# Patient Record
Sex: Female | Born: 1960 | Race: White | Hispanic: No | Marital: Married | Smoking: Never smoker
Health system: Southern US, Community
[De-identification: ages and names within clinical notes are randomized; demographics above are authoritative.]

## PROBLEM LIST (undated history)

## (undated) DIAGNOSIS — E039 Hypothyroidism, unspecified: Secondary | ICD-10-CM

## (undated) DIAGNOSIS — E538 Deficiency of other specified B group vitamins: Secondary | ICD-10-CM

## (undated) DIAGNOSIS — E0500 Thyrotoxicosis with diffuse goiter without thyrotoxic crisis or storm: Secondary | ICD-10-CM

## (undated) DIAGNOSIS — R200 Anesthesia of skin: Secondary | ICD-10-CM

## (undated) DIAGNOSIS — D649 Anemia, unspecified: Secondary | ICD-10-CM

## (undated) DIAGNOSIS — Z85820 Personal history of malignant melanoma of skin: Secondary | ICD-10-CM

## (undated) DIAGNOSIS — R202 Paresthesia of skin: Secondary | ICD-10-CM

## (undated) DIAGNOSIS — R531 Weakness: Secondary | ICD-10-CM

## (undated) DIAGNOSIS — D509 Iron deficiency anemia, unspecified: Secondary | ICD-10-CM

## (undated) HISTORY — DX: Deficiency of other specified B group vitamins: E53.8

## (undated) HISTORY — DX: Anesthesia of skin: R20.0

## (undated) HISTORY — DX: Personal history of malignant melanoma of skin: Z85.820

## (undated) HISTORY — DX: Thyrotoxicosis with diffuse goiter without thyrotoxic crisis or storm: E05.00

## (undated) HISTORY — DX: Iron deficiency anemia, unspecified: D50.9

## (undated) HISTORY — DX: Hypothyroidism, unspecified: E03.9

## (undated) HISTORY — DX: Paresthesia of skin: R20.2

## (undated) HISTORY — DX: Weakness: R53.1

## (undated) HISTORY — DX: Anemia, unspecified: D64.9

---

## 1998-11-22 ENCOUNTER — Ambulatory Visit (HOSPITAL_COMMUNITY): Admission: RE | Admit: 1998-11-22 | Discharge: 1998-11-22 | Payer: Self-pay | Admitting: *Deleted

## 1998-11-22 ENCOUNTER — Encounter: Payer: Self-pay | Admitting: *Deleted

## 2000-12-31 ENCOUNTER — Other Ambulatory Visit: Admission: RE | Admit: 2000-12-31 | Discharge: 2000-12-31 | Payer: Self-pay | Admitting: *Deleted

## 2002-12-21 ENCOUNTER — Other Ambulatory Visit: Admission: RE | Admit: 2002-12-21 | Discharge: 2002-12-21 | Payer: Self-pay | Admitting: Obstetrics and Gynecology

## 2002-12-22 ENCOUNTER — Ambulatory Visit (HOSPITAL_COMMUNITY): Admission: RE | Admit: 2002-12-22 | Discharge: 2002-12-22 | Payer: Self-pay | Admitting: Obstetrics and Gynecology

## 2002-12-22 ENCOUNTER — Encounter: Payer: Self-pay | Admitting: Obstetrics and Gynecology

## 2002-12-28 ENCOUNTER — Encounter: Admission: RE | Admit: 2002-12-28 | Discharge: 2002-12-28 | Payer: Self-pay | Admitting: Obstetrics and Gynecology

## 2002-12-28 ENCOUNTER — Encounter: Payer: Self-pay | Admitting: Obstetrics and Gynecology

## 2004-01-03 ENCOUNTER — Other Ambulatory Visit: Admission: RE | Admit: 2004-01-03 | Discharge: 2004-01-03 | Payer: Self-pay | Admitting: Obstetrics and Gynecology

## 2004-01-18 ENCOUNTER — Encounter: Admission: RE | Admit: 2004-01-18 | Discharge: 2004-01-18 | Payer: Self-pay | Admitting: Obstetrics and Gynecology

## 2004-01-29 ENCOUNTER — Encounter: Admission: RE | Admit: 2004-01-29 | Discharge: 2004-01-29 | Payer: Self-pay | Admitting: Obstetrics and Gynecology

## 2004-07-16 ENCOUNTER — Encounter: Admission: RE | Admit: 2004-07-16 | Discharge: 2004-07-16 | Payer: Self-pay | Admitting: Obstetrics and Gynecology

## 2004-12-25 ENCOUNTER — Other Ambulatory Visit: Admission: RE | Admit: 2004-12-25 | Discharge: 2004-12-25 | Payer: Self-pay | Admitting: Obstetrics and Gynecology

## 2005-01-23 ENCOUNTER — Encounter: Admission: RE | Admit: 2005-01-23 | Discharge: 2005-01-23 | Payer: Self-pay | Admitting: Obstetrics and Gynecology

## 2005-12-10 ENCOUNTER — Ambulatory Visit: Payer: Self-pay | Admitting: Internal Medicine

## 2006-01-04 ENCOUNTER — Ambulatory Visit: Payer: Self-pay | Admitting: Internal Medicine

## 2006-01-21 ENCOUNTER — Ambulatory Visit: Payer: Self-pay | Admitting: Internal Medicine

## 2006-04-01 ENCOUNTER — Encounter: Admission: RE | Admit: 2006-04-01 | Discharge: 2006-04-01 | Payer: Self-pay | Admitting: Obstetrics and Gynecology

## 2007-04-04 ENCOUNTER — Encounter: Admission: RE | Admit: 2007-04-04 | Discharge: 2007-04-04 | Payer: Self-pay | Admitting: Obstetrics and Gynecology

## 2008-09-27 ENCOUNTER — Encounter: Admission: RE | Admit: 2008-09-27 | Discharge: 2008-09-27 | Payer: Self-pay | Admitting: Obstetrics and Gynecology

## 2010-08-29 NOTE — Assessment & Plan Note (Signed)
[facility]                           GASTROENTEROLOGY OFFICE NOTE   NAME:Katherine Bradford, Katherine Bradford                      MRN:          [id number]  DATE:12/10/2005                            DOB:          25-Dec-1960    REFERRING PHYSICIAN:  Deatra James, MD   HISTORY OF PRESENT ILLNESS:  Katherine Bradford is a 50 year old white female who is  here self-referred for problems with swallowing.  For the past four months,  she had fullness when she swallowed solid food.  This is intermittent and  not every day.  She denies any problem with swallowing saliva or liquids.  The first episode occurred in February of this year and lasted about four to  six weeks and then subsided, but three months ago, she had another episode.  Since then, she has had some trouble with swallowing solids.  She has had no  regurgitation of the food, no heartburn.  Several years ago, she was  diagnosed with gastroesophageal reflux, but never had any x-rays or  endoscopic studies done.  There is a history of Graves disease diagnosed  many years ago and followed by Dr. Dagoberto Ligas.  She has not taken any PPIs or  suppressing agent, but does take calcium in Tums.   MEDICATIONS:  None.   PAST MEDICAL HISTORY:  Thyroid problems, Graves disease 22 years ago.   FAMILY HISTORY:  Positive for heart disease in her father.   SOCIAL HISTORY:  Married with two children.  She is a Sales executive.  She  does not smoke and does not drink alcohol.   REVIEW OF SYSTEMS:  Positive for fever, skin rash, night sweats, sore  throat.   PHYSICAL EXAMINATION:  VITAL SIGNS:  Blood pressure 108/78, pulse 68, weight  151 pounds.  GENERAL:  She was alert and oriented, in no distress.  Her voice was normal.  NECK:  Supple.  There was no goiter.  LUNGS:  Clear to auscultation.  No wheezes or rales.  COR:  Normal S1, normal S2.  ABDOMEN:  Soft and nontender with normoactive bowel sounds.  EXTREMITIES:  No edema.   IMPRESSION:  A 50 year old white female with intermittent globus sensation  in her throat, not necessarily dysphagia.  This could be from esophagitis  due to gastroesophageal reflux, rule out esophageal spasm, esophageal web,  or esophageal stricture.  She has no symptoms of gastroesophageal reflux.   PLAN:  I have discussed upper endoscopy and passive esophageal dilation  versus barium swallow as well as trial of a PPI.  She would like to try the  PPI first using Protonix 40 mg daily.  I gave her book from [employer] on  gastroesophageal reflux as well as prescription for Protonix that  she will take for six weeks.  After six weeks, we will decide whether she  needs endoscopy or barium swallow and also if she needs to stay on Protonix  or discontinue it.  Katherine Bradford. [name], MD   DMB/MedQ  DD:  12/10/2005  DT:  12/11/2005  Job #:  [id number]   cc:   Deatra James, MD

## 2011-03-03 ENCOUNTER — Other Ambulatory Visit: Payer: Self-pay | Admitting: Obstetrics and Gynecology

## 2011-03-03 DIAGNOSIS — Z1231 Encounter for screening mammogram for malignant neoplasm of breast: Secondary | ICD-10-CM

## 2011-03-27 ENCOUNTER — Ambulatory Visit
Admission: RE | Admit: 2011-03-27 | Discharge: 2011-03-27 | Disposition: A | Discharge status: Home / Self Care | Payer: BC Managed Care – PPO | Source: Ambulatory Visit | Attending: Obstetrics and Gynecology | Admitting: Obstetrics and Gynecology

## 2011-03-27 DIAGNOSIS — Z1231 Encounter for screening mammogram for malignant neoplasm of breast: Secondary | ICD-10-CM

## 2011-04-08 ENCOUNTER — Encounter: Payer: Self-pay | Admitting: Internal Medicine

## 2011-05-15 ENCOUNTER — Other Ambulatory Visit: Payer: BC Managed Care – PPO | Admitting: Internal Medicine

## 2012-03-28 ENCOUNTER — Other Ambulatory Visit: Payer: Self-pay | Admitting: Obstetrics and Gynecology

## 2012-03-28 DIAGNOSIS — Z1231 Encounter for screening mammogram for malignant neoplasm of breast: Secondary | ICD-10-CM

## 2012-04-05 ENCOUNTER — Ambulatory Visit
Admission: RE | Admit: 2012-04-05 | Discharge: 2012-04-05 | Disposition: A | Discharge status: Home / Self Care | Payer: BC Managed Care – PPO | Source: Ambulatory Visit | Attending: Obstetrics and Gynecology | Admitting: Obstetrics and Gynecology

## 2012-04-05 DIAGNOSIS — Z1231 Encounter for screening mammogram for malignant neoplasm of breast: Secondary | ICD-10-CM

## 2012-08-30 ENCOUNTER — Emergency Department (HOSPITAL_COMMUNITY): Payer: BC Managed Care – PPO

## 2012-08-30 ENCOUNTER — Emergency Department (HOSPITAL_COMMUNITY)
Admission: EM | Admit: 2012-08-30 | Discharge: 2012-08-30 | Disposition: A | Discharge status: Home / Self Care | Payer: BC Managed Care – PPO | Attending: Emergency Medicine | Admitting: Emergency Medicine

## 2012-08-30 ENCOUNTER — Encounter (HOSPITAL_COMMUNITY): Payer: Self-pay | Admitting: Emergency Medicine

## 2012-08-30 DIAGNOSIS — S0990XA Unspecified injury of head, initial encounter: Secondary | ICD-10-CM | POA: Insufficient documentation

## 2012-08-30 DIAGNOSIS — Y92009 Unspecified place in unspecified non-institutional (private) residence as the place of occurrence of the external cause: Secondary | ICD-10-CM | POA: Insufficient documentation

## 2012-08-30 DIAGNOSIS — W19XXXA Unspecified fall, initial encounter: Secondary | ICD-10-CM

## 2012-08-30 DIAGNOSIS — S0230XA Fracture of orbital floor, unspecified side, initial encounter for closed fracture: Secondary | ICD-10-CM | POA: Insufficient documentation

## 2012-08-30 DIAGNOSIS — S0232XA Fracture of orbital floor, left side, initial encounter for closed fracture: Secondary | ICD-10-CM

## 2012-08-30 DIAGNOSIS — Y9301 Activity, walking, marching and hiking: Secondary | ICD-10-CM | POA: Insufficient documentation

## 2012-08-30 DIAGNOSIS — W108XXA Fall (on) (from) other stairs and steps, initial encounter: Secondary | ICD-10-CM | POA: Insufficient documentation

## 2012-08-30 MED ORDER — OXYCODONE-ACETAMINOPHEN 5-325 MG PO TABS
2.0000 | ORAL_TABLET | Freq: Once | ORAL | Status: AC
  Administered 2012-08-30: 2 via ORAL
  Filled 2012-08-30: qty 2

## 2012-08-30 MED ORDER — ONDANSETRON 4 MG PO TBDP
ORAL_TABLET | ORAL | Status: AC
  Administered 2012-08-30: 4 mg via ORAL
  Filled 2012-08-30: qty 1

## 2012-08-30 MED ORDER — OXYCODONE-ACETAMINOPHEN 5-325 MG PO TABS: 2.0000 | ORAL_TABLET | ORAL | Status: DC | PRN

## 2012-08-30 MED ORDER — ONDANSETRON 4 MG PO TBDP
4.0000 mg | ORAL_TABLET | Freq: Once | ORAL | Status: AC
  Administered 2012-08-30: 4 mg via ORAL

## 2012-08-30 MED ORDER — ONDANSETRON 4 MG PO TBDP: 4.0000 mg | ORAL_TABLET | Freq: Three times a day (TID) | ORAL | Status: DC | PRN

## 2012-08-30 NOTE — ED Notes (Signed)
Patient comes in stating she fell at home. Patient states she lost her footing. Landed on left side of head and hit left cheek. Patient denies any LOC. Patient is awake and oriented at this time. Patient only complaining of head pain.

## 2012-08-30 NOTE — ED Provider Notes (Signed)
History     CSN: [id number]  Arrival date & time 08/30/12  9562   First MD Initiated Contact with Patient 08/30/12 0630      Chief Complaint  Patient presents with  . Fall    (Consider location/radiation/quality/duration/timing/severity/associated sxs/prior treatment) HPI Comments: Patient is a 52 year old female who presents to the ED after a fall this morning. Patient reports walking down the stairs when she slipped and landed on the hardwood floor on her left face. Patient currently complains of left sided facial pain. She reports trying to break her fall with her right arm but denies right arm pain at this time. The left facial pain started suddenly after the fall and is throbbing and severe. Palpation makes the pain worse. Nothing makes the pain better. Patient has not tried anything for pain relief. No other associated symptoms. Patient denies LOC or any other injury.   Patient is a 52 y.o. female presenting with fall.  Fall Associated symptoms include headaches.    History reviewed. No pertinent past medical history.  History reviewed. No pertinent past surgical history.  History reviewed. No pertinent family history.  History  Substance Use Topics  . Smoking status: Never Smoker   . Smokeless tobacco: Not on file  . Alcohol Use: No    OB History   Grav Para Term Preterm Abortions TAB SAB Ect Mult Living                  Review of Systems  HENT:       Facial pain  Neurological: Positive for headaches.  All other systems reviewed and are negative.    Allergies  Prednisone; Epinephrine; and Keflex  Home Medications   Current Outpatient Rx  Name  Route  Sig  Dispense  Refill  . meloxicam (MOBIC) 15 MG tablet   Oral   Take 15 mg by mouth daily.           BP 101/57  Pulse 58  Temp(Src) 98.1 F (36.7 C) (Oral)  Resp 16  SpO2 99%  Physical Exam  Nursing note and vitals reviewed. Constitutional: She is oriented to person, place, and time. She  appears well-developed and well-nourished. No distress.  HENT:  Head: Normocephalic and atraumatic.  Mouth/Throat: Oropharynx is clear and moist. No oropharyngeal exudate.  Left zygomatic tenderness to palpation. No obvious deformity. No bruising noted.   Eyes: Conjunctivae and EOM are normal. Pupils are equal, round, and reactive to light. No scleral icterus.  EOM intact of left eye. No hyphema noted.   Neck: Normal range of motion.  Cardiovascular: Normal rate and regular rhythm.  Exam reveals no gallop and no friction rub.   No murmur heard. Pulmonary/Chest: Effort normal and breath sounds normal. She has no wheezes. She has no rales. She exhibits no tenderness.  Abdominal: Soft. She exhibits no distension. There is no tenderness. There is no rebound and no guarding.  Musculoskeletal: Normal range of motion.  Neurological: She is alert and oriented to person, place, and time. No cranial nerve deficit. Coordination normal.  Speech is goal-oriented. Moves limbs without ataxia.   Skin: Skin is warm and dry.  Psychiatric: She has a normal mood and affect. Her behavior is normal.    ED Course  Procedures (including critical care time)  Labs Reviewed - No data to display Ct Head Wo Contrast  08/30/2012   *RADIOLOGY REPORT*  Clinical Data:  History of trauma from a fall with injury to the head and  neck.  CT HEAD WITHOUT CONTRAST CT MAXILLOFACIAL WITHOUT CONTRAST CT CERVICAL SPINE WITHOUT CONTRAST  Technique:  Multidetector CT imaging of the head, cervical spine, and maxillofacial structures were performed using the standard protocol without intravenous contrast. Multiplanar CT image reconstructions of the cervical spine and maxillofacial structures were also generated.  Comparison:  No priors.  CT HEAD  Findings: No acute displaced skull fractures are identified.  No acute intracranial abnormality.  Specifically, no evidence of acute post-traumatic intracranial hemorrhage, no definite regions of  acute/subacute cerebral ischemia, no focal mass, mass effect, hydrocephalus or abnormal intra or extra-axial fluid collections. The visualized paranasal sinuses and mastoids are well pneumatized with the exception of a small amount of opacification in the left ethmoid sinuses.  IMPRESSION: 1.  No acute displaced skull fractures or acute intracranial abnormalities. 2.  The appearance of the brain is normal.  CT MAXILLOFACIAL  Findings:  There is a defect in the lamina papyracea on the left with a small amount of extraconal orbital fat extending into the defect in the left ethmoid sinuses. Along the inferior margin of this there is a tiny collection of high attenuation material (image 52 of series 6 and image 24 of series 602) which may represent some chronic inspissated secretions, or may represent a small amount of hemorrhage.  Other than this finding, there are no new other overt signs of acute trauma.  It is uncertain whether not this represents a chronic injury or an acute medial orbital blowout fracture. No entrapment of extraocular muscles.  No other acute displaced facial bone fractures are identified.  Mandibular condyles are properly located.  Pterygoid plates are intact.  Polypoid soft tissue attenuation lesions are present within the maxillary sinuses bilaterally and may represent mucosal retention cysts and/or polyps.  Bilateral globes are intact.  There is a small amount of soft tissue swelling in the subcutaneous fat anterior to the left maxilla, presumably a soft tissue contusion.  IMPRESSION: 1.  Soft tissue contusion in the subcutaneous fat into the left maxilla.   Small defect in the left lamina papyracea and herniation of small amount of extraconal orbital fat into the defect in the left ethmoid sinuses. There is one small focus of high attenuation material adjacent to this which could represent a small amount of blood, or may simply represent chronic inspissated secretions. Clinical correlation  for signs and symptoms of acute left-sided orbital blowout fracture is recommended.  If there is no prior history of trauma, this injury is likely acute. 2.  No other evidence of acute displaced facial bone fractures are noted. 3.  Mucosal retention cyst versus polyps in the maxillary sinuses bilaterally.  CT CERVICAL SPINE  Findings:   No acute displaced fractures of the cervical spine are noted.  Alignment is anatomic.  Prevertebral soft tissues are normal.  There is mild multilevel degenerative disc disease, most severe at C5-C6.  IMPRESSION: 1.  No evidence of significant acute traumatic injury to the cervical spine.  These results were called by telephone on 08/30/2012 at 08:35 a.m. to PA [facility], who verbally acknowledged these results.   Original Report Authenticated By: Trudie Reed, M.D.   Ct Cervical Spine Wo Contrast  08/30/2012   *RADIOLOGY REPORT*  Clinical Data:  History of trauma from a fall with injury to the head and neck.  CT HEAD WITHOUT CONTRAST CT MAXILLOFACIAL WITHOUT CONTRAST CT CERVICAL SPINE WITHOUT CONTRAST  Technique:  Multidetector CT imaging of the head, cervical spine, and maxillofacial structures were performed using  the standard protocol without intravenous contrast. Multiplanar CT image reconstructions of the cervical spine and maxillofacial structures were also generated.  Comparison:  No priors.  CT HEAD  Findings: No acute displaced skull fractures are identified.  No acute intracranial abnormality.  Specifically, no evidence of acute post-traumatic intracranial hemorrhage, no definite regions of acute/subacute cerebral ischemia, no focal mass, mass effect, hydrocephalus or abnormal intra or extra-axial fluid collections. The visualized paranasal sinuses and mastoids are well pneumatized with the exception of a small amount of opacification in the left ethmoid sinuses.  IMPRESSION: 1.  No acute displaced skull fractures or acute intracranial abnormalities. 2.  The  appearance of the brain is normal.  CT MAXILLOFACIAL  Findings:  There is a defect in the lamina papyracea on the left with a small amount of extraconal orbital fat extending into the defect in the left ethmoid sinuses. Along the inferior margin of this there is a tiny collection of high attenuation material (image 52 of series 6 and image 24 of series 602) which may represent some chronic inspissated secretions, or may represent a small amount of hemorrhage.  Other than this finding, there are no new other overt signs of acute trauma.  It is uncertain whether not this represents a chronic injury or an acute medial orbital blowout fracture. No entrapment of extraocular muscles.  No other acute displaced facial bone fractures are identified.  Mandibular condyles are properly located.  Pterygoid plates are intact.  Polypoid soft tissue attenuation lesions are present within the maxillary sinuses bilaterally and may represent mucosal retention cysts and/or polyps.  Bilateral globes are intact.  There is a small amount of soft tissue swelling in the subcutaneous fat anterior to the left maxilla, presumably a soft tissue contusion.  IMPRESSION: 1.  Soft tissue contusion in the subcutaneous fat into the left maxilla.   Small defect in the left lamina papyracea and herniation of small amount of extraconal orbital fat into the defect in the left ethmoid sinuses. There is one small focus of high attenuation material adjacent to this which could represent a small amount of blood, or may simply represent chronic inspissated secretions. Clinical correlation for signs and symptoms of acute left-sided orbital blowout fracture is recommended.  If there is no prior history of trauma, this injury is likely acute. 2.  No other evidence of acute displaced facial bone fractures are noted. 3.  Mucosal retention cyst versus polyps in the maxillary sinuses bilaterally.  CT CERVICAL SPINE  Findings:   No acute displaced fractures of the  cervical spine are noted.  Alignment is anatomic.  Prevertebral soft tissues are normal.  There is mild multilevel degenerative disc disease, most severe at C5-C6.  IMPRESSION: 1.  No evidence of significant acute traumatic injury to the cervical spine.  These results were called by telephone on 08/30/2012 at 08:35 a.m. to PA [facility], who verbally acknowledged these results.   Original Report Authenticated By: Trudie Reed, M.D.   Ct Maxillofacial Wo Cm  08/30/2012   *RADIOLOGY REPORT*  Clinical Data:  History of trauma from a fall with injury to the head and neck.  CT HEAD WITHOUT CONTRAST CT MAXILLOFACIAL WITHOUT CONTRAST CT CERVICAL SPINE WITHOUT CONTRAST  Technique:  Multidetector CT imaging of the head, cervical spine, and maxillofacial structures were performed using the standard protocol without intravenous contrast. Multiplanar CT image reconstructions of the cervical spine and maxillofacial structures were also generated.  Comparison:  No priors.  CT HEAD  Findings: No acute displaced  skull fractures are identified.  No acute intracranial abnormality.  Specifically, no evidence of acute post-traumatic intracranial hemorrhage, no definite regions of acute/subacute cerebral ischemia, no focal mass, mass effect, hydrocephalus or abnormal intra or extra-axial fluid collections. The visualized paranasal sinuses and mastoids are well pneumatized with the exception of a small amount of opacification in the left ethmoid sinuses.  IMPRESSION: 1.  No acute displaced skull fractures or acute intracranial abnormalities. 2.  The appearance of the brain is normal.  CT MAXILLOFACIAL  Findings:  There is a defect in the lamina papyracea on the left with a small amount of extraconal orbital fat extending into the defect in the left ethmoid sinuses. Along the inferior margin of this there is a tiny collection of high attenuation material (image 52 of series 6 and image 24 of series 602) which may represent some  chronic inspissated secretions, or may represent a small amount of hemorrhage.  Other than this finding, there are no new other overt signs of acute trauma.  It is uncertain whether not this represents a chronic injury or an acute medial orbital blowout fracture. No entrapment of extraocular muscles.  No other acute displaced facial bone fractures are identified.  Mandibular condyles are properly located.  Pterygoid plates are intact.  Polypoid soft tissue attenuation lesions are present within the maxillary sinuses bilaterally and may represent mucosal retention cysts and/or polyps.  Bilateral globes are intact.  There is a small amount of soft tissue swelling in the subcutaneous fat anterior to the left maxilla, presumably a soft tissue contusion.  IMPRESSION: 1.  Soft tissue contusion in the subcutaneous fat into the left maxilla.   Small defect in the left lamina papyracea and herniation of small amount of extraconal orbital fat into the defect in the left ethmoid sinuses. There is one small focus of high attenuation material adjacent to this which could represent a small amount of blood, or may simply represent chronic inspissated secretions. Clinical correlation for signs and symptoms of acute left-sided orbital blowout fracture is recommended.  If there is no prior history of trauma, this injury is likely acute. 2.  No other evidence of acute displaced facial bone fractures are noted. 3.  Mucosal retention cyst versus polyps in the maxillary sinuses bilaterally.  CT CERVICAL SPINE  Findings:   No acute displaced fractures of the cervical spine are noted.  Alignment is anatomic.  Prevertebral soft tissues are normal.  There is mild multilevel degenerative disc disease, most severe at C5-C6.  IMPRESSION: 1.  No evidence of significant acute traumatic injury to the cervical spine.  These results were called by telephone on 08/30/2012 at 08:35 a.m. to PA [facility], who verbally acknowledged these results.    Original Report Authenticated By: Trudie Reed, M.D.     1. Fracture of orbital floor, blow-out, left, closed, initial encounter   2. Fall, initial encounter       MDM  7:02 AM CT head, cervical spine and face pending. Patient will have percocet for pain. No neuro deficits. Vitals stable and patient afebrile.   9:00 AM Maxillofacial CT shows an acute blowout fracture of the left orbital floor. EOM intact of affected eye. Patient will be discharged with instructions to apply ice to the injury and follow up with Dr. Jeanice Lim who is Maxillofacial on call doc. Patient will have a prescription for pain medication. Patient understands and is agreeable to the plan.       Emilia Beck, [place] 08/30/12 [phone]

## 2012-08-30 NOTE — ED Notes (Signed)
Patient transported to CT 

## 2012-08-30 NOTE — ED Notes (Addendum)
C/o nausea & vomiting. ED PA informed & aware.

## 2012-08-31 NOTE — ED Provider Notes (Signed)
Medical screening examination/treatment/procedure(s) were conducted as a shared visit with non-physician practitioner(s) and myself.  I personally evaluated the patient during the encounter S/p trip and fall today. Contusion to face. Ct. No hyphema. Normal eom. No visual c/o.   Suzi Roots, MD 08/31/12 [phone]

## 2012-09-20 ENCOUNTER — Encounter: Payer: Self-pay | Admitting: Neurology

## 2012-09-20 DIAGNOSIS — R202 Paresthesia of skin: Secondary | ICD-10-CM | POA: Insufficient documentation

## 2012-09-20 DIAGNOSIS — R200 Anesthesia of skin: Secondary | ICD-10-CM | POA: Insufficient documentation

## 2012-09-20 DIAGNOSIS — E0500 Thyrotoxicosis with diffuse goiter without thyrotoxic crisis or storm: Secondary | ICD-10-CM | POA: Insufficient documentation

## 2012-09-20 DIAGNOSIS — R531 Weakness: Secondary | ICD-10-CM | POA: Insufficient documentation

## 2012-09-21 ENCOUNTER — Ambulatory Visit (INDEPENDENT_AMBULATORY_CARE_PROVIDER_SITE_OTHER): Payer: BC Managed Care – PPO | Admitting: Neurology

## 2012-09-21 ENCOUNTER — Telehealth: Payer: Self-pay | Admitting: Neurology

## 2012-09-21 ENCOUNTER — Encounter: Payer: Self-pay | Admitting: Neurology

## 2012-09-21 VITALS — BP 116/78 | HR 68 | Ht 64.25 in | Wt 159.0 lb

## 2012-09-21 DIAGNOSIS — R200 Anesthesia of skin: Secondary | ICD-10-CM

## 2012-09-21 DIAGNOSIS — E538 Deficiency of other specified B group vitamins: Secondary | ICD-10-CM

## 2012-09-21 DIAGNOSIS — R209 Unspecified disturbances of skin sensation: Secondary | ICD-10-CM

## 2012-09-21 DIAGNOSIS — E0500 Thyrotoxicosis with diffuse goiter without thyrotoxic crisis or storm: Secondary | ICD-10-CM

## 2012-09-21 MED ORDER — GABAPENTIN (ONCE-DAILY) 300 MG PO TABS: 300.0000 mg | ORAL_TABLET | Freq: Three times a day (TID) | ORAL | Status: DC

## 2012-09-21 NOTE — Telephone Encounter (Signed)
I spoke to pt and she is asking questions about MRI.  I told her Luster Landsberg, is her contact in our office.   She is asking when this might be done.   I told her authorization by insurance is needed.  Could be up to one week for this.  May have at [facility], since [place].

## 2012-09-21 NOTE — Progress Notes (Signed)
History of present illness:  Katherine Bradford is a 52 years old right-handed Caucasian female, referred to hand surgeon Dr. Teressa Senter, and her primary care physician Dr. Wynelle Link for evaluation of bilateral fingertips paresthesia  Over the past one year, without any triggering event, she noticed bilateral fingertips numbness tingling hypersensitivity, initially only involving left thumb, and index fingers, her symptoms gradually worse over the past one year,  It is involving all 5 fingers on the left and right hand, left worse than right, She described hypersensitivity over fingertips, difficulty to make a tight grip,   paresthesia also extending to her elbow level, she burned her finger while getting hot pizza out of the oven, she works as a Sales executive, 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  She denies neck pain, no shooting pain from her neck to her arm, and hands,   She denies bilateral lower extremity paresthesia, no leg weakness, no incontinence,   Review of Systems  Out of a complete 14 system review, the patient complains of only the following symptoms, and all other reviewed systems are negative.   Constitutional:   fatigue Cardiovascular:  murmur Ear/Nose/Throat:  N/A Skin: N/A Eyes: blurred vision Respiratory: N/A Gastroitestinal: N/A    Hematology/Lymphatic:  Anemia Endocrine:  N/A Musculoskeletal:N/A Allergy/Immunology: N/A Neurological:  Numbness, weakness. Psychiatric:    N/A  PHYSICAL EXAMINATOINS:  Generalized: In no acute distress  Neck: Supple, no carotid bruits   Cardiac: Regular rate rhythm  Pulmonary: Clear to auscultation bilaterally  Musculoskeletal: No deformity  Neurological examination  Mentation: Alert oriented to time, place, history taking, and causual conversation  Cranial nerve II-XII: Pupils were equal round reactive to light extraocular movements were full, visual field were full on confrontational test. facial sensation and strength were normal.  hearing was intact to finger rubbing bilaterally. Uvula tongue midline.  head turning and shoulder shrug and were normal and symmetric.Tongue protrusion into cheek strength was normal.  Motor: normal tone, bulk and strength.  Sensory: hypersensitivity of bilateral finger tips,  preserved vibratory sensation, and proprioception at toes.  Coordination: Normal finger to nose, heel-to-shin bilaterally there was no truncal ataxia  Gait: Rising up from seated position without assistance, normal stance, without trunk ataxia, moderate stride, good arm swing, smooth turning, able to perform tiptoe, and heel walking without difficulty.   Romberg signs: Negative  Deep tendon reflexes: Brachioradialis 2/2, biceps 2/2, triceps 2/2, patellar 2/2, Achilles 2/2, plantar responses were flexor bilaterally.  Assessment and plan: 52 years old right-handed Caucasian female, with past medical history of Graves' disease, anemia, now presenting with bilateral fingertips hypersensitivity, reported normal examination,  1.Differentiation diagnosis including vasculitis, cervical pathology. 2 MRI of cervical laboratory evaluation,.  3. Gabapentin 300 mg 3 times a day 4 return to clinic in 3-4 weeks.

## 2012-09-22 LAB — CBC
HCT: 39.6 % (ref 34.0–46.6)
MCV: 95 fL (ref 79–97)
Platelets: 292 10*3/uL (ref 155–379)
RBC: 4.17 x10E6/uL (ref 3.77–5.28)
RDW: 21.7 % — ABNORMAL HIGH (ref 12.3–15.4)
WBC: 6.1 10*3/uL (ref 3.4–10.8)

## 2012-09-22 LAB — COMPREHENSIVE METABOLIC PANEL
ALT: 15 IU/L (ref 0–32)
BUN/Creatinine Ratio: 15 (ref 9–23)
CO2: 25 mmol/L (ref 19–28)
Creatinine, Ser: 0.78 mg/dL (ref 0.57–1.00)
GFR calc non Af Amer: 88 mL/min/{1.73_m2} (ref >59)
Globulin, Total: 2.6 g/dL (ref 1.5–4.5)
Glucose: 95 mg/dL (ref 65–99)
Sodium: 141 mmol/L (ref 134–144)
Total Protein: 7.1 g/dL (ref 6.0–8.5)

## 2012-09-22 LAB — TSH: TSH: 6.77 u[IU]/mL — ABNORMAL HIGH (ref 0.450–4.500)

## 2012-09-22 LAB — HIV ANTIBODY (ROUTINE TESTING W REFLEX)
HIV 1/O/2 Abs-Index Value: <1 (ref <1.00)
HIV-1/HIV-2 Ab: NONREACTIVE

## 2012-09-22 LAB — FOLATE: Folate: >19.9 ng/mL (ref >3.0)

## 2012-09-22 LAB — ANA: Anti Nuclear Antibody(ANA): NEGATIVE

## 2012-09-22 LAB — VITAMIN B12: Vitamin B-12: <31 pg/mL — ABNORMAL LOW (ref 211–946)

## 2012-09-23 NOTE — Progress Notes (Signed)
Quick Note:  I left message for her to call back. Sandy, please call her again.  Low Vit B12 <31. She needs to come in for repeat lab test today, order placed,  1. also started vit b12 supplement today after lab draw, im once x 2 weeks, once once a week x4 months,   ______

## 2012-09-23 NOTE — Addendum Note (Signed)
Addended by: Levert Feinstein on: 09/23/2012 09:59 AM   Modules accepted: Orders

## 2012-09-23 NOTE — Addendum Note (Signed)
Addended by: Levert Feinstein on: 09/23/2012 10:03 AM   Modules accepted: Orders

## 2012-09-26 ENCOUNTER — Telehealth: Payer: Self-pay | Admitting: Neurology

## 2012-09-26 NOTE — Telephone Encounter (Signed)
I have left a message twice about low B12 and needing another blood draw and Vit B12 injections, no return call.

## 2012-09-27 ENCOUNTER — Encounter: Payer: Self-pay | Admitting: *Deleted

## 2012-09-27 NOTE — Progress Notes (Signed)
Mailed letter to pt , re: lab results and B12 supplementation.

## 2012-09-28 ENCOUNTER — Encounter: Payer: Self-pay | Admitting: Gastroenterology

## 2012-09-29 ENCOUNTER — Encounter: Payer: Self-pay | Admitting: Neurology

## 2012-09-30 NOTE — Telephone Encounter (Signed)
I have left her two messages for patient to return call and have not heard back. A letter was mailed to patient on 09-30-12, asking her to call the office about coming in for another blood draw, and explaining treatment plan.

## 2012-10-03 ENCOUNTER — Telehealth: Payer: Self-pay | Admitting: Neurology

## 2012-10-03 ENCOUNTER — Telehealth: Payer: Self-pay

## 2012-10-03 ENCOUNTER — Encounter: Payer: Self-pay | Admitting: *Deleted

## 2012-10-03 NOTE — Telephone Encounter (Signed)
Patient called in response to letter she received in the mail. She states that it is better to contact her at work. Patient schedule for B12 and bloodwork on Wednesday.

## 2012-10-03 NOTE — Telephone Encounter (Signed)
I have called her low Vit B12, <31, she will start supplement soon.  I will refer her to GI  Dr. Bonnye Fava for evaluation of low Vitamin B12.

## 2012-10-05 ENCOUNTER — Ambulatory Visit (INDEPENDENT_AMBULATORY_CARE_PROVIDER_SITE_OTHER): Payer: BC Managed Care – PPO

## 2012-10-05 ENCOUNTER — Other Ambulatory Visit: Payer: Self-pay | Admitting: Neurology

## 2012-10-05 DIAGNOSIS — E538 Deficiency of other specified B group vitamins: Secondary | ICD-10-CM

## 2012-10-05 MED ORDER — CYANOCOBALAMIN 1000 MCG/ML IJ SOLN
1000.0000 ug | Freq: Once | INTRAMUSCULAR | Status: AC
  Administered 2012-10-06: 1000 ug via INTRAMUSCULAR

## 2012-10-05 NOTE — Progress Notes (Signed)
Cyanocobalamin 1000 mcg/ml administered in L Deltoid under aseptic technique. Bandaid applied. Tolerated well.

## 2012-10-05 NOTE — Patient Instructions (Signed)
Counseled on rationale for use. Patient to schedule next dose for tomorrow.

## 2012-10-06 ENCOUNTER — Ambulatory Visit (INDEPENDENT_AMBULATORY_CARE_PROVIDER_SITE_OTHER): Payer: BC Managed Care – PPO | Admitting: Neurology

## 2012-10-06 DIAGNOSIS — E538 Deficiency of other specified B group vitamins: Secondary | ICD-10-CM

## 2012-10-06 NOTE — Progress Notes (Signed)
Patient here for B12 injection.  Under aseptic technique Cyanocobalamin 1000mcg/1ml given IM in right deltoid.  Tolerated well.  Band-Aid applied. 

## 2012-10-06 NOTE — Patient Instructions (Signed)
Patient will return tomorrow for second B12 injection.

## 2012-10-07 ENCOUNTER — Encounter: Payer: Self-pay | Admitting: *Deleted

## 2012-10-07 ENCOUNTER — Ambulatory Visit: Payer: BC Managed Care – PPO | Admitting: Gastroenterology

## 2012-10-07 ENCOUNTER — Ambulatory Visit (INDEPENDENT_AMBULATORY_CARE_PROVIDER_SITE_OTHER): Payer: BC Managed Care – PPO

## 2012-10-07 DIAGNOSIS — E538 Deficiency of other specified B group vitamins: Secondary | ICD-10-CM

## 2012-10-07 MED ORDER — CYANOCOBALAMIN 1000 MCG/ML IJ SOLN
1000.0000 ug | Freq: Once | INTRAMUSCULAR | Status: AC
  Administered 2012-10-07: 1000 ug via INTRAMUSCULAR

## 2012-10-07 NOTE — Patient Instructions (Addendum)
We are low on B12. Patient will check to see of her pharmacy has any and we can write an order for her to pick up medication.

## 2012-10-07 NOTE — Progress Notes (Signed)
Cyanocobalamin 1000 mcg/ml administered into L Deltoid under aseptic technique. Bandaid applied.

## 2012-10-10 ENCOUNTER — Ambulatory Visit (INDEPENDENT_AMBULATORY_CARE_PROVIDER_SITE_OTHER): Payer: BC Managed Care – PPO

## 2012-10-10 DIAGNOSIS — E538 Deficiency of other specified B group vitamins: Secondary | ICD-10-CM

## 2012-10-10 MED ORDER — CYANOCOBALAMIN 1000 MCG/ML IJ SOLN
1000.0000 ug | Freq: Once | INTRAMUSCULAR | Status: AC
  Administered 2012-10-10 – 2012-10-11 (×2): 1000 ug via INTRAMUSCULAR

## 2012-10-10 NOTE — Progress Notes (Signed)
Patient in for cyanocobalmin injection. Administered in Left Deltoid under aseptic technique per order. Patient tolerated well. Band aid applied.

## 2012-10-10 NOTE — Patient Instructions (Signed)
Return in 1 week.

## 2012-10-11 ENCOUNTER — Telehealth: Payer: Self-pay | Admitting: Neurology

## 2012-10-11 ENCOUNTER — Ambulatory Visit (INDEPENDENT_AMBULATORY_CARE_PROVIDER_SITE_OTHER): Payer: BC Managed Care – PPO

## 2012-10-11 DIAGNOSIS — E538 Deficiency of other specified B group vitamins: Secondary | ICD-10-CM

## 2012-10-11 LAB — IRON AND TIBC
Iron: 42 ug/dL (ref 35–155)
TIBC: 336 ug/dL (ref 250–450)
UIBC: 294 ug/dL (ref 150–375)

## 2012-10-11 LAB — IFE AND PE, SERUM
Albumin/Glob SerPl: 1.3 (ref 0.7–2.0)
Gamma Glob SerPl Elph-Mcnc: 1.1 g/dL (ref 0.5–1.6)
Globulin, Total: 3.1 g/dL (ref 2.0–4.5)
IgA/Immunoglobulin A, Serum: 252 mg/dL (ref 91–414)
IgG (Immunoglobin G), Serum: 1200 mg/dL (ref 700–1600)
IgM (Immunoglobulin M), Srm: 90 mg/dL (ref 40–230)

## 2012-10-11 LAB — METHYLMALONIC ACID, SERUM: Methylmalonic Acid: 16091 nmol/L (ref 0–378)

## 2012-10-11 LAB — FERRITIN: Ferritin: 9 ng/mL — ABNORMAL LOW (ref 15–150)

## 2012-10-11 LAB — VITAMIN B12: Vitamin B-12: <31 pg/mL — ABNORMAL LOW (ref 211–946)

## 2012-10-11 LAB — HOMOCYSTEINE: Homocysteine: 153.7 umol/L — ABNORMAL HIGH (ref 0.0–15.0)

## 2012-10-11 MED ORDER — CYANOCOBALAMIN 1000 MCG/ML IJ SOLN
1000.0000 ug | Freq: Once | INTRAMUSCULAR | Status: AC
  Administered 2012-10-12: 1000 ug via INTRAMUSCULAR

## 2012-10-11 NOTE — Telephone Encounter (Signed)
Please call patient, repeat lab confirmed low vitamin B12, ferritin, I will also refer her to hematologist for evaluation.

## 2012-10-11 NOTE — Progress Notes (Signed)
Administered cyanocobalamin (B12) in L deltoid under aseptic technique. Band aid applied.

## 2012-10-12 ENCOUNTER — Ambulatory Visit (INDEPENDENT_AMBULATORY_CARE_PROVIDER_SITE_OTHER): Payer: BC Managed Care – PPO

## 2012-10-12 DIAGNOSIS — E538 Deficiency of other specified B group vitamins: Secondary | ICD-10-CM

## 2012-10-12 NOTE — Patient Instructions (Signed)
Return tomorrow  

## 2012-10-12 NOTE — Progress Notes (Signed)
Administered cyanocobalamin 1000 mcg/ml R Deltoid under aseptic technique. Bandaid applied.

## 2012-10-13 ENCOUNTER — Ambulatory Visit (INDEPENDENT_AMBULATORY_CARE_PROVIDER_SITE_OTHER): Payer: BC Managed Care – PPO

## 2012-10-13 DIAGNOSIS — E538 Deficiency of other specified B group vitamins: Secondary | ICD-10-CM

## 2012-10-13 MED ORDER — CYANOCOBALAMIN 1000 MCG/ML IJ SOLN
1000.0000 ug | Freq: Once | INTRAMUSCULAR | Status: AC
  Administered 2012-10-13: 1000 ug via INTRAMUSCULAR

## 2012-10-13 NOTE — Progress Notes (Signed)
Cyanocobalamin 1000 mcg administered in R Deltoid under aseptic technique. Band aid applied.

## 2012-10-13 NOTE — Telephone Encounter (Signed)
Pt here for B12 injection. Given information below by Geraldine Contras, RN.

## 2012-10-17 ENCOUNTER — Telehealth: Payer: Self-pay | Admitting: Hematology and Oncology

## 2012-10-17 ENCOUNTER — Encounter: Payer: Self-pay | Admitting: Internal Medicine

## 2012-10-17 ENCOUNTER — Other Ambulatory Visit (INDEPENDENT_AMBULATORY_CARE_PROVIDER_SITE_OTHER): Payer: BC Managed Care – PPO

## 2012-10-17 ENCOUNTER — Ambulatory Visit (INDEPENDENT_AMBULATORY_CARE_PROVIDER_SITE_OTHER): Payer: BC Managed Care – PPO | Admitting: Internal Medicine

## 2012-10-17 ENCOUNTER — Ambulatory Visit (INDEPENDENT_AMBULATORY_CARE_PROVIDER_SITE_OTHER): Payer: BC Managed Care – PPO | Admitting: Neurology

## 2012-10-17 VITALS — BP 116/78 | HR 86 | Ht 64.25 in | Wt 157.0 lb

## 2012-10-17 DIAGNOSIS — D510 Vitamin B12 deficiency anemia due to intrinsic factor deficiency: Secondary | ICD-10-CM

## 2012-10-17 DIAGNOSIS — Z1211 Encounter for screening for malignant neoplasm of colon: Secondary | ICD-10-CM

## 2012-10-17 DIAGNOSIS — E538 Deficiency of other specified B group vitamins: Secondary | ICD-10-CM

## 2012-10-17 MED ORDER — CYANOCOBALAMIN 1000 MCG/ML IJ SOLN
1000.0000 ug | Freq: Once | INTRAMUSCULAR | Status: AC
  Administered 2012-10-17 – 2012-10-18 (×2): 1000 ug via INTRAMUSCULAR

## 2012-10-17 MED ORDER — MOVIPREP 100 G PO SOLR: 1.0000 | Freq: Once | ORAL | Status: DC

## 2012-10-17 NOTE — Telephone Encounter (Signed)
LVOM FOR PT TO RETURN CALL IN RE NP APPT.  PT SCHEDULED TO SEE DR. SALEM REFERRING DR. Terrace Arabia DX- VITAMIN B12 DEF WELCOME PACKET MAILED

## 2012-10-17 NOTE — Telephone Encounter (Signed)
C/D 10/17/12 for appt. 12/06/12

## 2012-10-17 NOTE — Progress Notes (Signed)
Patient here for B12 injection.  Under aseptic technique Cyanocobalamin 1000mcg/1ml given IM in left deltoid.  Tolerated well.  Band-Aid applied. 

## 2012-10-17 NOTE — Patient Instructions (Addendum)
You have been scheduled for an endoscopy and colonoscopy with propofol. Please follow the written instructions given to you at your visit today. Please pick up your prep at the pharmacy within the next 1-3 days. If you use inhalers (even only as needed), please bring them with you on the day of your procedure. Your physician has requested that you go to www.startemmi.com and enter the access code given to you at your visit today. This web site gives a general overview about your procedure. However, you should still follow specific instructions given to you by our office regarding your preparation for the procedure.  Your physician has requested that you go to the basement for the following lab work before leaving today: Celiac Panel, IgA, Intrinsic factor antibody, serum gastrin, antiparietal cell antibody, vitamin d deficiency.  CC: Dr Arelia Sneddon, Dr Terrace Arabia, Dr Marvene Staff

## 2012-10-17 NOTE — Patient Instructions (Signed)
Patient will return tomorrow for next B12 injection.

## 2012-10-17 NOTE — Progress Notes (Signed)
Katherine Bradford November 26, 1960 MRN [id number]  History of Present Illness:  This is a 52 year old white female with a low B12 level of less than 30 pg, history of anemia and history of numbness in her hands and feet. 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 She has been on B12 replacement for the past several weeks.. She has received a total of 8 injections of the B12 so far . We saw her in 2007 for an upper endoscopy for dysphagia. She had candida esophagitis which was treated appropriately. Patient denies a history of celiac disease, inflammatory bowel disease or pernicious anemia. She denies any GI issues such as abdominal pain, diarrhea or weight loss. Her brother is also on B12 injections but patient does not know why.  Patient has been on iron supplements for many years. At the age of 16, patient was diagnosed with Graves disease and was treated with PTU and radioactive iodine. She is currently on no thyroid medications, her recent TSH  Was 6.7   Past Medical History  Diagnosis Date  . Numbness and tingling in hands   . Graves disease   . Weakness   . Anemia   . B12 deficiency    History reviewed. No pertinent past surgical history.  reports that she has never smoked. She has never used smokeless tobacco. She reports that she does not drink alcohol or use illicit drugs. family history includes Diabetes type II in her brother; Heart disease in her father; Heart failure in her father; High blood pressure in her mother; Osteoarthritis in her mother; and Thyroid disease in her mother.  There is no history of Colon cancer. Allergies  Allergen Reactions  . Prednisone Other (See Comments)    Patient prefers not to take due to the edginess it gives her  . Epinephrine Palpitations  . Keflex (Cephalexin) Rash        Review of Systems:  The remainder of the 10 point ROS is negative except as outlined in H&P   Physical Exam: General appearance  Well developed, in no distress. Prematurely gray hair Eyes-  non icteric. HEENT nontraumatic, normocephalic. Mouth no lesions, tongue papillated, no cheilosis. Neck supple without adenopathy, thyroid not enlarged, no carotid bruits, no JVD. Lungs Clear to auscultation bilaterally. Cor normal S1, normal S2, regular rhythm, no murmur,  quiet precordium. Abdomen: Soft nontender abdomen with normal active bowel sounds. No distention. Liver edge at costal margin. Rectal: Soft Hemoccult negative stool. Extremities no pedal edema. Skin no lesions. Early palmar erythema Neurological alert and oriented x 3. Psychological normal mood and affect.  Assessment and Plan:  Problem #71 52 year old white female with severe B12 deficiency raising a question of either pernicious anemia or celiac disease. She has a history of iron deficiency as well as neuropathy which is likely related to B12 deficiency. She needs to be further evaluated. We will plan an upper endoscopy with small bowel biopsies to rule out celiac disease and gastric biopsies to rule out gastric atrophy. At the same time, we are checking her intrinsic factor antibodies, antiparietal cell antibody, sprue profile, IgA level. serum gastrin levels and vitamin D levels. Her TSH is also elevated at 6.7 which may be related to the history of radioactive iodine treatment. or anti thyroid antibodies.  Problem #2 Colorectal screening. She will undergo a screening colonoscopy at the time of her upper endoscopy. We have discussed the prep, the sedation and the procedure.   10/17/2012 Lina Sar

## 2012-10-18 ENCOUNTER — Ambulatory Visit (INDEPENDENT_AMBULATORY_CARE_PROVIDER_SITE_OTHER): Payer: BC Managed Care – PPO | Admitting: Neurology

## 2012-10-18 DIAGNOSIS — E538 Deficiency of other specified B group vitamins: Secondary | ICD-10-CM

## 2012-10-18 LAB — CELIAC PANEL 10
Gliadin IgA: 5.8 U/mL (ref <20)
IgA: 251 mg/dL (ref 69–380)

## 2012-10-18 LAB — VITAMIN D 25 HYDROXY (VIT D DEFICIENCY, FRACTURES): Vit D, 25-Hydroxy: 53 ng/mL (ref 30–89)

## 2012-10-18 NOTE — Patient Instructions (Signed)
Patient will return tomorrow at 0800 for B12 injection.

## 2012-10-18 NOTE — Progress Notes (Signed)
Under aseptic technique cyanocobalamin 1061mcg/1ml given IM in right gluteal.  Tolerated well.  Band-Aid applied.

## 2012-10-19 ENCOUNTER — Ambulatory Visit
Admission: RE | Admit: 2012-10-19 | Discharge: 2012-10-19 | Disposition: A | Discharge status: Home / Self Care | Payer: BC Managed Care – PPO | Source: Ambulatory Visit | Attending: Neurology | Admitting: Neurology

## 2012-10-19 ENCOUNTER — Ambulatory Visit (INDEPENDENT_AMBULATORY_CARE_PROVIDER_SITE_OTHER): Payer: BC Managed Care – PPO | Admitting: *Deleted

## 2012-10-19 DIAGNOSIS — E538 Deficiency of other specified B group vitamins: Secondary | ICD-10-CM

## 2012-10-19 DIAGNOSIS — E0500 Thyrotoxicosis with diffuse goiter without thyrotoxic crisis or storm: Secondary | ICD-10-CM

## 2012-10-19 DIAGNOSIS — R202 Paresthesia of skin: Secondary | ICD-10-CM

## 2012-10-19 DIAGNOSIS — R209 Unspecified disturbances of skin sensation: Secondary | ICD-10-CM

## 2012-10-19 DIAGNOSIS — R200 Anesthesia of skin: Secondary | ICD-10-CM

## 2012-10-19 MED ORDER — CYANOCOBALAMIN 1000 MCG/ML IJ SOLN
1000.0000 ug | Freq: Once | INTRAMUSCULAR | Status: AC
  Administered 2012-10-19: 1000 ug via INTRAMUSCULAR

## 2012-10-19 NOTE — Progress Notes (Signed)
Pt here for B 12 injection.  Under aseptic technique cyanocobalamin 1040mcg/1ml IM given L deltoid  .  Tolerated well.  Bandaid applied.   Pt to return in one week.

## 2012-10-19 NOTE — Patient Instructions (Signed)
Pt to return in one week for another B 12 injection.

## 2012-10-20 LAB — INTRINSIC FACTOR ANTIBODIES: Intrinsic Factor: POSITIVE — AB

## 2012-10-20 NOTE — Progress Notes (Signed)
Quick Note:  Please call patient, MRI cervical spine showed abnormalities consistent with B12 deficiency ______

## 2012-10-21 LAB — ANTIPARIETAL CELL ANTIBODY: Parietal Cell Ab: 32.3 Units — ABNORMAL HIGH (ref 0.0–20.0)

## 2012-10-21 LAB — GASTRIN, SERUM: Gastrin: 1727 pg/mL — ABNORMAL HIGH (ref 0–115)

## 2012-10-24 ENCOUNTER — Encounter: Payer: Self-pay | Admitting: Internal Medicine

## 2012-10-27 ENCOUNTER — Ambulatory Visit (INDEPENDENT_AMBULATORY_CARE_PROVIDER_SITE_OTHER): Payer: BC Managed Care – PPO | Admitting: *Deleted

## 2012-10-27 ENCOUNTER — Telehealth: Payer: Self-pay | Admitting: Hematology & Oncology

## 2012-10-27 DIAGNOSIS — E538 Deficiency of other specified B group vitamins: Secondary | ICD-10-CM

## 2012-10-27 MED ORDER — CYANOCOBALAMIN 1000 MCG/ML IJ SOLN
1000.0000 ug | Freq: Once | INTRAMUSCULAR | Status: AC
  Administered 2012-10-27: 1000 ug via INTRAMUSCULAR

## 2012-10-27 NOTE — Telephone Encounter (Signed)
Pt wanted to come and see Dr. Myna Hidalgo. She is aware of 8-18 appointment and that 8-26 has been cx.

## 2012-10-27 NOTE — Patient Instructions (Addendum)
Pt to be seen next week as per ordered.

## 2012-10-27 NOTE — Progress Notes (Signed)
Pt here for B 12 injection.  Under aseptic technique cyanocobalamin 1054mcg/1ml IM given R deltoid.  Tolerated well.  Bandaid applied.  Pt has appt with [facility], hematologist on 12-06-12.  Was asking about Dr. Myna Hidalgo.  She wanted to see if could be seen by him sooner.   I called and spoke to [facility] at Dr. Gustavo Lah office.   He would show notes to Dr. Myna Hidalgo and then call pt with appt.   Thought she could be seen sooner.  I told him to call her work # tomorrow (she was off today).

## 2012-10-28 ENCOUNTER — Ambulatory Visit: Payer: BC Managed Care – PPO

## 2012-10-31 ENCOUNTER — Telehealth: Payer: Self-pay | Admitting: Neurology

## 2012-10-31 NOTE — Telephone Encounter (Signed)
When I spoke to patient and relayed MRI cervical results, she was concerned about the abnormalities.  Dr. Terrace Arabia she  doesn't have a follow up scheduled, do you need to see patient again?  She wanted you to know that she has an appointment with hematology on 11-28-12 and also has seen Dr. Lina Sar who has scheduled an endoscopy and colonoscopy.  Please advise.

## 2012-10-31 NOTE — Telephone Encounter (Signed)
Yes, I need to see her after hematology and GI visit.  Please give her an appointment after August 18th 2014

## 2012-10-31 NOTE — Progress Notes (Signed)
Quick Note:  I spoke to patient and relayed MRI cervical spine results showing abnormalities consistent with B12 deficiency, per Dr. Terrace Arabia. ______

## 2012-11-01 NOTE — Telephone Encounter (Signed)
Spoke to patient and relayed that the doctor wants to see her after 11-28-12.  Annabelle Harman can you get her an appointment, she can be reached at [phone] tomorrow or [phone] today, Tuesday.

## 2012-11-01 NOTE — Telephone Encounter (Signed)
Spoke to patient. Patient will follow up with Dr.Yan 12-01-2012. MRI results.

## 2012-11-04 ENCOUNTER — Ambulatory Visit (INDEPENDENT_AMBULATORY_CARE_PROVIDER_SITE_OTHER): Payer: BC Managed Care – PPO | Admitting: Neurology

## 2012-11-04 DIAGNOSIS — E538 Deficiency of other specified B group vitamins: Secondary | ICD-10-CM

## 2012-11-04 MED ORDER — CYANOCOBALAMIN 1000 MCG/ML IJ SOLN
1000.0000 ug | Freq: Once | INTRAMUSCULAR | Status: AC
  Administered 2012-11-04: 1000 ug via INTRAMUSCULAR

## 2012-11-04 NOTE — Progress Notes (Signed)
Patient here for B12 injection.  Under aseptic technique Cyanocobalamin 1041mcg/1ml given IM in left deltoid.  Tolerated well.  Band-Aid applied.  Patient is interested in self administering B12 injections.  I told her that would be fine, we can teach her how to do injection.  She also has a nurse at work that might be able to help.

## 2012-11-04 NOTE — Patient Instructions (Signed)
Patient will return next week for B12 injection. 

## 2012-11-11 ENCOUNTER — Ambulatory Visit (INDEPENDENT_AMBULATORY_CARE_PROVIDER_SITE_OTHER): Payer: BC Managed Care – PPO | Admitting: *Deleted

## 2012-11-11 ENCOUNTER — Ambulatory Visit: Payer: BC Managed Care – PPO

## 2012-11-11 DIAGNOSIS — E538 Deficiency of other specified B group vitamins: Secondary | ICD-10-CM

## 2012-11-11 MED ORDER — CYANOCOBALAMIN 1000 MCG/ML IJ SOLN
1000.0000 ug | Freq: Once | INTRAMUSCULAR | Status: AC
  Administered 2012-11-11: 1000 ug via INTRAMUSCULAR

## 2012-11-11 NOTE — Progress Notes (Signed)
Pt here for B 12 injection.  Under aseptic technique cyanocobalamin 1000mcg/1ml IM given R deltoid.  Tolerated well.  Bandaid applied.  

## 2012-11-11 NOTE — Patient Instructions (Signed)
See back in 2 wks.

## 2012-11-17 ENCOUNTER — Ambulatory Visit (INDEPENDENT_AMBULATORY_CARE_PROVIDER_SITE_OTHER): Payer: BC Managed Care – PPO | Admitting: Neurology

## 2012-11-17 DIAGNOSIS — E538 Deficiency of other specified B group vitamins: Secondary | ICD-10-CM

## 2012-11-17 MED ORDER — CYANOCOBALAMIN 1000 MCG/ML IJ SOLN
1000.0000 ug | Freq: Once | INTRAMUSCULAR | Status: AC
  Administered 2012-11-17: 1000 ug via INTRAMUSCULAR

## 2012-11-17 NOTE — Patient Instructions (Signed)
Patient will call to set up next weeks injection.

## 2012-11-17 NOTE — Progress Notes (Signed)
Patient here for B12 injection.  Under aseptic technique Cyanocobalamin 1000mcg/1ml given IM in left deltoid.  Tolerated well.  Band-Aid applied. 

## 2012-11-25 ENCOUNTER — Ambulatory Visit (INDEPENDENT_AMBULATORY_CARE_PROVIDER_SITE_OTHER): Payer: BC Managed Care – PPO | Admitting: Neurology

## 2012-11-25 DIAGNOSIS — E538 Deficiency of other specified B group vitamins: Secondary | ICD-10-CM

## 2012-11-25 MED ORDER — CYANOCOBALAMIN 1000 MCG/ML IJ SOLN
1000.0000 ug | Freq: Once | INTRAMUSCULAR | Status: AC
  Administered 2012-11-25: 1000 ug via INTRAMUSCULAR

## 2012-11-25 NOTE — Progress Notes (Signed)
Patient here for B12 injection.  Under aseptic technique Cyanocobalamin 1000mcg/1ml given IM in right deltoid.  Tolerated well.  Band-Aid applied. 

## 2012-11-25 NOTE — Patient Instructions (Signed)
Patient will return next week to see Dr. Terrace Arabia and have her B12 injection then.

## 2012-11-28 ENCOUNTER — Ambulatory Visit (HOSPITAL_BASED_OUTPATIENT_CLINIC_OR_DEPARTMENT_OTHER): Payer: BC Managed Care – PPO | Admitting: Hematology & Oncology

## 2012-11-28 ENCOUNTER — Other Ambulatory Visit (HOSPITAL_BASED_OUTPATIENT_CLINIC_OR_DEPARTMENT_OTHER): Payer: BC Managed Care – PPO | Admitting: Lab

## 2012-11-28 ENCOUNTER — Ambulatory Visit: Payer: BC Managed Care – PPO

## 2012-11-28 VITALS — BP 108/58 | HR 68 | Temp 98.0°F | Resp 16 | Ht 64.0 in | Wt 153.0 lb

## 2012-11-28 DIAGNOSIS — G5690 Unspecified mononeuropathy of unspecified upper limb: Secondary | ICD-10-CM

## 2012-11-28 DIAGNOSIS — D509 Iron deficiency anemia, unspecified: Secondary | ICD-10-CM

## 2012-11-28 DIAGNOSIS — D529 Folate deficiency anemia, unspecified: Secondary | ICD-10-CM

## 2012-11-28 DIAGNOSIS — D510 Vitamin B12 deficiency anemia due to intrinsic factor deficiency: Secondary | ICD-10-CM

## 2012-11-28 DIAGNOSIS — E538 Deficiency of other specified B group vitamins: Secondary | ICD-10-CM

## 2012-11-28 DIAGNOSIS — R6889 Other general symptoms and signs: Secondary | ICD-10-CM

## 2012-11-28 DIAGNOSIS — R5381 Other malaise: Secondary | ICD-10-CM

## 2012-11-28 LAB — CBC WITH DIFFERENTIAL (CANCER CENTER ONLY)
BASO#: 0 10*3/uL (ref 0.0–0.2)
Eosinophils Absolute: 0.2 10*3/uL (ref 0.0–0.5)
HCT: 41.9 % (ref 34.8–46.6)
HGB: 13.8 g/dL (ref 11.6–15.9)
LYMPH%: 32.2 % (ref 14.0–48.0)
MCH: 29.2 pg (ref 26.0–34.0)
MCV: 89 fL (ref 81–101)
MONO%: 6.2 % (ref 0.0–13.0)
RBC: 4.72 10*6/uL (ref 3.70–5.32)

## 2012-11-28 MED ORDER — FOLIC ACID 1 MG PO TABS: 2.0000 mg | ORAL_TABLET | Freq: Every day | ORAL | Status: DC

## 2012-11-28 NOTE — Progress Notes (Signed)
This office note has been dictated.

## 2012-11-29 LAB — IRON AND TIBC CHCC
%SAT: 9 % — ABNORMAL LOW (ref 21–57)
UIBC: 322 ug/dL (ref 120–384)

## 2012-11-29 LAB — FERRITIN CHCC: Ferritin: 9 ng/ml (ref 9–269)

## 2012-11-29 NOTE — Progress Notes (Signed)
CC:   Katherine Bradford, M.D. Katherine Feinstein, MD Katherine Bradford. Katherine Chance, MD Katherine Bradford, M.D.  DIAGNOSES: 1. 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 2. Possible folic acid deficiency. 3. Iron deficiency anemia.  HISTORY OF PRESENT ILLNESS:  Katherine Bradford is a very charming, 52 year old white female.  She has a very interesting history.  She began to develop neuropathy in her hands.  She works as a Sales executive.  This has been going on for about a year.  She has been seen by multiple doctors.  She has been seen by Neurology. She has been seen by Gastroenterology.  She has been seen by Hand Surgery.  She was seen by Dr. Wynelle Link, this is a family doctor.  Dr. Wynelle Link referred her to Dr. Terrace Arabia of [facility].  Dr. Terrace Arabia went ahead and ran some studies on her.  There is no evidence of carpal tunnel.  She did not have any neurologic changes on a nerve conduction study.  Dr. Terrace Arabia went ahead and tested her for a vitamin B12 deficiency. Shockingly enough, she had a vitamin B12 level of less than 31.  She also had a ferritin of 9.  Confirmatory studies showed that she had a methylmalonic acid level of 16,000.  She had positive parietal cell antibodies at 32.  She had positive intrinsic-factor antibodies.  She had a negative SPEP.  She had a gastrin level of 1727.  She was tested for celiac disease.  These were all negative.  Her homocystine level was quite elevated at 153.  Her thyroid studies looked okay.  TSH was minimally elevated at 6.  She was kindly referred to [facility].  She has been started on B12 shots.  She got I think daily B12 for 10 days and now is getting it weekly for 12 weeks.  She is on multivitamins with iron.  She still has the neuropathy.  She has been seen by Dr. Teressa Senter of Orthopedic Surgery.  He cannot find any nerve damage.  She, again, was referred to the [facility].  She is having no problems walking.  There are no sensory issues with her feet.  She has had no dysphagia or odynophagia.  She does not "chew ice."  She does feel tired.  There has been no headache.  She has not lost any hair.  She has not noticed any skin changes.  PAST MEDICAL HISTORY:  Her past medical history is relatively unremarkable.  She does have a history of Graves' disease.  ALLERGIES: 1. Prednisone. 2. Keflex. 3. Epinephrine.  MEDICATIONS: 1. Vitamin B12 1 mg IM every week. 2. Folic acid 400 mcg p.o. daily. 3. Multivitamins daily.  SOCIAL HISTORY:  Negative for tobacco use.  There is no alcohol use. She has no history of obvious occupational exposures.  She has no obvious risk factors for HIV or hepatitis.  FAMILY HISTORY:  Negative for any hematologic issues.  She says that her brother takes oral B12.  REVIEW OF SYSTEMS:  As stated in the history of present illness.  No additional findings noted on a 12-system review.  PHYSICAL EXAMINATION:  General:  This is a well-developed, well- nourished white female in no obvious distress.  Vital Signs: Temperature of 98, pulse 68, respiratory rate 18, blood pressure 108/58. Weight is 153.  Head/Neck:  Exam shows a normocephalic and atraumatic skull.  There are no ocular or oral lesions.  She has  no conjunctival pallor.  She has no angular cheilitis.  There is no adenopathy in the neck.  The thyroid is not palpable.  Lungs:  Clear bilaterally. Cardiac:  Regular rate and rhythm with a normal S1 and S2.  There are no murmurs, rubs, or bruits.  Abdomen:  Soft.  The patient has good bowel sounds.  There is no fluid wave.  There is no palpable hepatosplenomegaly.  Extremities:  Show no clubbing, cyanosis, or edema. She has good strength in her arms and legs.  She has good pulses in her distal lower extremities.  Neurological:  Exam shows no obvious neurological deficits.  She seems to be intact to sensation  and vibration.  Skin:  No rashes, ecchymosis, or petechia.  LABORATORY STUDIES:  White cell count is 6.1, hemoglobin 13.8, hematocrit 41.9, platelet count 301,000.  MCV is 88.  Peripheral smear shows a normochromic normocytic population of red blood cells.  There are no nucleated red blood cells.  I see no teardrop cells.  There are no target cells.  There are no schistocytes.  I see no polychromasia.  She has no rouleaux formation.  White cells appear normal in morphology and maturation.  There are no immature myeloid or lymphoid forms.  There may be a couple of hypersegmented polys.  I see no blasts.  Platelets are adequate in number and size.  IMPRESSION:  Katherine Bradford is a very charming, 52 year old white female.  I have recommended that she still have her monthly cycles.  She has a combination of vitamin B12 deficiency (pernicious anemia) and iron deficiency.  I think she also may have a folic acid deficiency.  I went ahead and put her on 2 mg daily of folic acid.  I noticed that her homocystine level was quite high.  This could be elevated in B12 deficiency, but also could be elevated in folic acid deficiency.  It is possible that the neuropathy might not be getting better if she is still folate deficient.  She sees Dr. Juanda Bradford soon for an upper and lower endoscopy.  Her gastrin level is incredibly high.  One has to wonder about the possibility of atrophic gastritis.  She does not have diarrhea or dyspepsia that would suggest a hypergastrinemic situation.  I find this very fascinating.  She has a history of Graves' disease. 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  She does not have Addison's disease.  She does not have diabetes.  I want to hold on giving her IV iron right now.  I just want to see how she does taking the multivitamins daily.  I told her that she can go to monthly B12  now.  I have to believe that her B12 levels have been replaced with all vitamin B12 that she has gotten to date.  She sees Dr. Wynelle Link later on this week.  Hopefully, Dr. Wynelle Link will agree that she can go to monthly B12.  Again, this is a very fascinating situation.  I cannot remember the last time I saw a B12 level that was this low.  She has both parietal cell antibodies and intrinsic factor antibodies.  I will see Katherine Bradford back in 1 month.  If she still feels tired, then we may have to go ahead with IV iron.    ______________________________ Josph Macho, M.D. PRE/MEDQ  D:  11/28/2012  T:  11/29/2012  Job:  [id number]

## 2012-11-30 ENCOUNTER — Encounter: Payer: Self-pay | Admitting: Internal Medicine

## 2012-11-30 ENCOUNTER — Ambulatory Visit (AMBULATORY_SURGERY_CENTER): Payer: BC Managed Care – PPO | Admitting: Internal Medicine

## 2012-11-30 VITALS — BP 121/66 | HR 58 | Temp 98.1°F | Resp 14 | Ht 64.0 in | Wt 157.0 lb

## 2012-11-30 DIAGNOSIS — K2990 Gastroduodenitis, unspecified, without bleeding: Secondary | ICD-10-CM

## 2012-11-30 DIAGNOSIS — D510 Vitamin B12 deficiency anemia due to intrinsic factor deficiency: Secondary | ICD-10-CM

## 2012-11-30 DIAGNOSIS — Z1211 Encounter for screening for malignant neoplasm of colon: Secondary | ICD-10-CM

## 2012-11-30 DIAGNOSIS — K2970 Gastritis, unspecified, without bleeding: Secondary | ICD-10-CM

## 2012-11-30 DIAGNOSIS — D1330 Benign neoplasm of unspecified part of small intestine: Secondary | ICD-10-CM

## 2012-11-30 DIAGNOSIS — E538 Deficiency of other specified B group vitamins: Secondary | ICD-10-CM

## 2012-11-30 MED ORDER — SODIUM CHLORIDE 0.9 % IV SOLN: 500.0000 mL | INTRAVENOUS | Status: DC

## 2012-11-30 NOTE — Op Note (Signed)
[facility] [address] [place], [address]   ENDOSCOPY PROCEDURE REPORT  PATIENT: Katherine Bradford, Katherine Bradford  MR#: [id number] BIRTHDATE: 1960/10/15 , 51  yrs. old GENDER: Female ENDOSCOPIST: Hart Carwin, MD REFERRED BY:  Deatra James, M.D. PROCEDURE DATE:  11/30/2012 PROCEDURE:  EGD w/ biopsy ASA CLASS:     Class II INDICATIONS:  severe B12 deficiency, anti IF antibodies and anto PC antibodies positive, celiac profile is negative MEDICATIONS: MAC sedation, administered by CRNA and propofol (Diprivan) 250mg  IV TOPICAL ANESTHETIC: none  DESCRIPTION OF PROCEDURE: After the risks benefits and alternatives of the procedure were thoroughly explained, informed consent was obtained.  The LB [id number] endoscope was introduced through the mouth and advanced to the second portion of the duodenum. Without limitations.  The instrument was slowly withdrawn as the mucosa was fully examined.      [Esophagus: esophageal mucosa was normal throughout the proximal mid and distal esophagus the squamous columnar junction was normal. There was no esophageal stricture or hiatal hernia Stomach: Gastric mucosa appeared atrophic throughout the body and gastric antrum there was lack of rugal folds and there were visible submucosal blood vessels. Multiple biopsies were taken from the gastric body to rule out gastric atrophy gastric antrum appeared essentially normal with minimal erythema. Biopsies were taken also to rule out pernicious anemia. Gastric outlet was normal. Retroflexion of the endoscope confirmed the absence of rugal folds.  Duodenum: Duodenal bulb and descending duodenum is normal multiple biopsies were taken from the second portion duodenum to rule out villous atrophy          The scope was then withdrawn from the patient and the procedure completed.  COMPLICATIONS: There were no complications. 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 Status post about biopsies from gastric body antrum and descending duodenum RECOMMENDATIONS: await biopsy results Continue B12 supplement  REPEAT EXAM: no  eSigned:  Hart Carwin, MD 11/30/2012 3:01 PM   CC:

## 2012-11-30 NOTE — Progress Notes (Signed)
Called to room to assist during endoscopic procedure.  Patient ID and intended procedure confirmed with present staff. Received instructions for my participation in the procedure from the performing physician.  

## 2012-11-30 NOTE — Progress Notes (Signed)
Patient did not experience any of the following events: a burn prior to discharge; a fall within the facility; wrong site/side/patient/procedure/implant event; or a hospital transfer or hospital admission upon discharge from the facility. (G8907) Patient did not have preoperative order for IV antibiotic SSI prophylaxis. (G8918)  

## 2012-11-30 NOTE — Patient Instructions (Addendum)
Impressions/recommendations:  Atrophic gastritis (handout given)  Continue B-12 supplement  Normal colonoscopy High Fiber Diet  Repeat colonoscopy in 10 years.  YOU HAD AN ENDOSCOPIC PROCEDURE TODAY AT THE [facility]: Refer to the procedure report that was given to you for any specific questions about what was found during the examination.  If the procedure report does not answer your questions, please call your gastroenterologist to clarify.  If you requested that your care partner not be given the details of your procedure findings, then the procedure report has been included in a sealed envelope for you to review at your convenience later.  YOU SHOULD EXPECT: Some feelings of bloating in the abdomen. Passage of more gas than usual.  Walking can help get rid of the air that was put into your GI tract during the procedure and reduce the bloating. If you had a lower endoscopy (such as a colonoscopy or flexible sigmoidoscopy) you may notice spotting of blood in your stool or on the toilet paper. If you underwent a bowel prep for your procedure, then you may not have a normal bowel movement for a few days.  DIET: Your first meal following the procedure should be a light meal and then it is ok to progress to your normal diet.  A half-sandwich or bowl of soup is an example of a good first meal.  Heavy or fried foods are harder to digest and may make you feel nauseous or bloated.  Likewise meals heavy in dairy and vegetables can cause extra gas to form and this can also increase the bloating.  Drink plenty of fluids but you should avoid alcoholic beverages for 24 hours.  ACTIVITY: Your care partner should take you home directly after the procedure.  You should plan to take it easy, moving slowly for the rest of the day.  You can resume normal activity the day after the procedure however you should NOT DRIVE or use heavy machinery for 24 hours (because of the sedation medicines used during  the test).    SYMPTOMS TO REPORT IMMEDIATELY: A gastroenterologist can be reached at any hour.  During normal business hours, 8:30 AM to 5:00 PM Monday through Friday, call [phone].  After hours and on weekends, please call the GI answering service at [phone] who will take a message and have the physician on call contact you.   Following lower endoscopy (colonoscopy or flexible sigmoidoscopy):  Excessive amounts of blood in the stool  Significant tenderness or worsening of abdominal pains  Swelling of the abdomen that is new, acute  Fever of 100F or higher  Following upper endoscopy (EGD)  Vomiting of blood or coffee ground material  New chest pain or pain under the shoulder blades  Painful or persistently difficult swallowing  New shortness of breath  Fever of 100F or higher  Black, tarry-looking stools  FOLLOW UP: If any biopsies were taken you will be contacted by phone or by letter within the next 1-3 weeks.  Call your gastroenterologist if you have not heard about the biopsies in 3 weeks.  Our staff will call the home number listed on your records the next business day following your procedure to check on you and address any questions or concerns that you may have at that time regarding the information given to you following your procedure. This is a courtesy call and so if there is no answer at the home number and we have not heard from you through the emergency  physician on call, we will assume that you have returned to your regular daily activities without incident.  SIGNATURES/CONFIDENTIALITY: You and/or your care partner have signed paperwork which will be entered into your electronic medical record.  These signatures attest to the fact that that the information above on your After Visit Summary has been reviewed and is understood.  Full responsibility of the confidentiality of this discharge information lies with you and/or your care-partner.

## 2012-11-30 NOTE — Op Note (Addendum)
[facility] [address] [place], [address]   COLONOSCOPY PROCEDURE REPORT  PATIENT: Katherine Bradford, Katherine Bradford  MR#: [id number] BIRTHDATE: Apr 02, 1961 , 51  yrs. old GENDER: Female ENDOSCOPIST: Hart Carwin, MD REFERRED XB:JYNWGN Wynelle Link, M.D. , Dr Richardean Chimera PROCEDURE DATE:  11/30/2012 PROCEDURE:   Colonoscopy, screening First Screening Colonoscopy - Avg.  risk and is 50 yrs.  old or older Yes.  Prior Negative Screening - Now for repeat screening. N/A  History of Adenoma - Now for follow-up colonoscopy & has been > or = to 3 yrs.  N/A  Polyps Removed Today? No.  Recommend repeat exam, <10 yrs? No. ASA CLASS:   Class II INDICATIONS:Average risk patient for colon cancer. MEDICATIONS: MAC sedation, administered by CRNA and propofol (Diprivan) 100mg  IV  DESCRIPTION OF PROCEDURE:   After the risks benefits and alternatives of the procedure were thoroughly explained, informed consent was obtained.  A digital rectal exam revealed no abnormalities of the rectum.   The LB [id number]  endoscope was introduced through the anus and advanced to the cecum, which was identified by both the appendix and ileocecal valve. No adverse events experienced.   The quality of the prep was good, using MoviPrep  The instrument was then slowly withdrawn as the colon was fully examined.      COLON FINDINGS: A normal appearing cecum, ileocecal valve, and appendiceal orifice were identified.  The ascending, hepatic flexure, transverse, splenic flexure, descending, sigmoid colon and rectum appeared unremarkable.  No polyps or cancers were seen. Retroflexed views revealed no abnormalities. The time to cecum=4 minutes 50 seconds.  Withdrawal time=6 minutes 51 seconds.  The scope was withdrawn and the procedure completed. COMPLICATIONS: There were no complications.  ENDOSCOPIC IMPRESSION: Normal colon  RECOMMENDATIONS: High fiber diet recall colonoscopy 10 years   eSigned:  Hart Carwin, MD 11/30/2012 3:14 PM Revised: 11/30/2012 3:14 PM  cc:

## 2012-12-01 ENCOUNTER — Telehealth: Payer: Self-pay

## 2012-12-01 ENCOUNTER — Encounter: Payer: Self-pay | Admitting: Neurology

## 2012-12-01 ENCOUNTER — Ambulatory Visit (INDEPENDENT_AMBULATORY_CARE_PROVIDER_SITE_OTHER): Payer: BC Managed Care – PPO | Admitting: *Deleted

## 2012-12-01 ENCOUNTER — Ambulatory Visit (INDEPENDENT_AMBULATORY_CARE_PROVIDER_SITE_OTHER): Payer: BC Managed Care – PPO | Admitting: Neurology

## 2012-12-01 VITALS — BP 127/66 | HR 74 | Ht 64.0 in | Wt 152.0 lb

## 2012-12-01 DIAGNOSIS — Z0289 Encounter for other administrative examinations: Secondary | ICD-10-CM

## 2012-12-01 DIAGNOSIS — E538 Deficiency of other specified B group vitamins: Secondary | ICD-10-CM

## 2012-12-01 DIAGNOSIS — R209 Unspecified disturbances of skin sensation: Secondary | ICD-10-CM

## 2012-12-01 DIAGNOSIS — R531 Weakness: Secondary | ICD-10-CM

## 2012-12-01 DIAGNOSIS — R5381 Other malaise: Secondary | ICD-10-CM

## 2012-12-01 DIAGNOSIS — E0500 Thyrotoxicosis with diffuse goiter without thyrotoxic crisis or storm: Secondary | ICD-10-CM

## 2012-12-01 DIAGNOSIS — R200 Anesthesia of skin: Secondary | ICD-10-CM

## 2012-12-01 MED ORDER — CYANOCOBALAMIN 1000 MCG/ML IJ SOLN: 1000.0000 ug | Freq: Once | INTRAMUSCULAR | Status: DC

## 2012-12-01 MED ORDER — SYRINGE (DISPOSABLE) 3 ML MISC: 3.0000 mg | Status: DC

## 2012-12-01 MED ORDER — CYANOCOBALAMIN 1000 MCG/ML IJ SOLN
1000.0000 ug | Freq: Once | INTRAMUSCULAR | Status: AC
  Administered 2012-12-01: 1000 ug via INTRAMUSCULAR

## 2012-12-01 NOTE — Telephone Encounter (Signed)
Left a message on the pt's answering machine for the pt to call if any questions or concerns [phone]. Maw

## 2012-12-01 NOTE — Patient Instructions (Signed)
See next week.  Husband to give injection next week.

## 2012-12-01 NOTE — Progress Notes (Signed)
History of present illness:  Katherine Bradford is a 52 years old right-handed Caucasian female, referred to hand surgeon Dr. Teressa Senter, and her primary care physician Dr. 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  She denies neck pain, no shooting pain from her neck to her arm, and hands,   She denies bilateral lower extremity paresthesia, no leg weakness, no incontinence,   UPDATE December 01 2012:  Evaluation showed extremely low B12 31, elevated methylmalonic acid level 16091, iron was low as well, 31, ferritin level 9, homocystine level was 53 point 7, positive intrinsic factor, antiparietal antibody, celiac workup was negative  EGD by Dr. Westley Gambles, atrophic-appearing gastric mucosa consistent with the atrophic gastritis, biopsy was pending, colonoscopy was normal.  She was also evaluated by oncologist Dr.Ennerver, he will coordinate her care, and supplement, consider IV iron supplement.  MRI of cervical spine showed hazy T2 hyperintense signal in the posterior columns from C2 to C7-T1, consistent with B12 deficiency, degenerative joint disease of cervical spine  She still complains of bilateral hands burning, all five fingers, palm, numbness from elbow down, joints are stiff.  She still works full time, she drop things,  She denies memory loss, she complains of fatigue.  she denies gait difficulty, no bilateral lower extremity paresthesia  weakness, she denies incontinence    Review of Systems  Out of a complete 14 system review, the patient complains of only the following symptoms, and all other reviewed systems are negative.   Constitutional:   fatigue Cardiovascular:  murmur Ear/Nose/Throat:  N/A Skin: N/A Eyes: blurred vision Respiratory: N/A Gastroitestinal: N/A    Hematology/Lymphatic:  Anemia Endocrine:  N/A Musculoskeletal:N/A Allergy/Immunology: N/A Neurological:  Numbness, weakness. Psychiatric:    N/A  PHYSICAL EXAMINATOINS:  Generalized: In no acute distress  Neck: Supple, no carotid bruits   Cardiac: Regular rate rhythm  Pulmonary: Clear to auscultation bilaterally  Musculoskeletal: No deformity  Neurological examination  Mentation: Alert oriented to time, place, history taking, and causual conversation  Cranial nerve II-XII: Pupils were equal round reactive to light extraocular movements were full, visual field were full on confrontational test. facial sensation and strength were normal. hearing was intact to finger rubbing bilaterally. Uvula tongue midline.  head turning and shoulder shrug and were normal and symmetric.Tongue protrusion into cheek strength was normal.  Motor: normal tone, bulk and strength.  Sensory: hypersensitivity of bilateral finger tips,  preserved vibratory sensation, and proprioception at toes and fingertips .  Coordination: Normal finger to nose, heel-to-shin bilaterally there was no truncal ataxia  Gait: Rising up from seated position without assistance, normal stance, without trunk ataxia, moderate stride, good arm swing, smooth turning, able to perform tiptoe, and heel walking without difficulty.   Romberg signs: Negative  Deep tendon reflexes: Brachioradialis 2/2, biceps 2/2, triceps 2/2, patellar 2/2, Achilles 2/2, plantar responses were flexor bilaterally.  Assessment and plan: 52 years old right-handed Caucasian female, with past medical history of  Graves' disease, anemia, now presenting with  bilateral fingertips hypersensitivity,  extensive evaluation has demonstrated Vitamin B12 deficiency, also with positive antiparietal antibody, positive intrinsic factor, EGD consistent with pernicious anemia.  1. continue vitamin B12, folic acid, iron supplement,. 2. continue followup with oncologist Dr. Myna Hidalgo. 3. Will return to clinic in 6 months

## 2012-12-01 NOTE — Progress Notes (Signed)
Pt here for B 12 injection.  Under aseptic technique cyanocobalamin 1069mcg/1ml IM given L deltoid.  Tolerated well.  Bandaid applied.   Husband was here to learn how to given injection.  I showed him the procedure then gave him instructions to practice on an orange.  Gave him alcohol prep , syringes, needles, to practice.  Is to call with questions.

## 2012-12-03 LAB — TRANSFERRIN RECEPTOR, SOLUABLE: Transferrin Receptor, Soluble: 2.36 mg/L — ABNORMAL HIGH (ref 0.76–1.76)

## 2012-12-03 LAB — RETICULOCYTES (CHCC)
ABS Retic: 33.5 10*3/uL (ref 19.0–186.0)
RBC.: 4.78 MIL/uL (ref 3.87–5.11)
Retic Ct Pct: 0.7 % (ref 0.4–2.3)

## 2012-12-03 LAB — LACTATE DEHYDROGENASE: LDH: 159 U/L (ref 94–250)

## 2012-12-03 LAB — VITAMIN B12: Vitamin B-12: 752 pg/mL (ref 211–911)

## 2012-12-03 LAB — INTRINSIC FACTOR ANTIBODIES: Intrinsic Factor: POSITIVE — AB

## 2012-12-03 LAB — ANTI-PARIETAL ANTIBODY: Parietal Cell Antibody-IgG: NEGATIVE

## 2012-12-05 ENCOUNTER — Encounter: Payer: Self-pay | Admitting: Internal Medicine

## 2012-12-06 ENCOUNTER — Other Ambulatory Visit: Payer: BC Managed Care – PPO | Admitting: Lab

## 2012-12-06 ENCOUNTER — Telehealth: Payer: Self-pay | Admitting: Oncology

## 2012-12-06 ENCOUNTER — Ambulatory Visit: Payer: BC Managed Care – PPO

## 2012-12-06 NOTE — Telephone Encounter (Addendum)
Message copied by Lacie Draft on Tue Dec 06, 2012  3:11 PM ------      Message from: Josph Macho      Created: Tue Dec 06, 2012  7:18 AM       Please call and let her know that her B12 level is back to normal!! She only needs monthly B12 shots at this point. Thanks. Pete ------Left message on voicemail.

## 2012-12-09 ENCOUNTER — Ambulatory Visit (INDEPENDENT_AMBULATORY_CARE_PROVIDER_SITE_OTHER): Payer: BC Managed Care – PPO | Admitting: Neurology

## 2012-12-09 DIAGNOSIS — E538 Deficiency of other specified B group vitamins: Secondary | ICD-10-CM

## 2012-12-09 MED ORDER — CYANOCOBALAMIN 1000 MCG/ML IJ SOLN
1000.0000 ug | Freq: Once | INTRAMUSCULAR | Status: AC
  Administered 2012-12-09: 1000 ug via INTRAMUSCULAR

## 2012-12-09 NOTE — Patient Instructions (Signed)
Patient will be receiving B12 from pharmacy and giving injections at home.

## 2012-12-09 NOTE — Progress Notes (Signed)
Under aseptic technique cyanocobalamin 10109mcg/1ml given IM in left deltoid.  Tolerated well.  Band-Aid applied.

## 2012-12-26 ENCOUNTER — Other Ambulatory Visit (HOSPITAL_BASED_OUTPATIENT_CLINIC_OR_DEPARTMENT_OTHER): Payer: BC Managed Care – PPO | Admitting: Lab

## 2012-12-26 ENCOUNTER — Ambulatory Visit (HOSPITAL_BASED_OUTPATIENT_CLINIC_OR_DEPARTMENT_OTHER): Payer: BC Managed Care – PPO | Admitting: Hematology & Oncology

## 2012-12-26 VITALS — BP 110/54 | HR 71 | Temp 98.1°F | Resp 16 | Ht 64.0 in | Wt 154.0 lb

## 2012-12-26 DIAGNOSIS — K2940 Chronic atrophic gastritis without bleeding: Secondary | ICD-10-CM

## 2012-12-26 DIAGNOSIS — E538 Deficiency of other specified B group vitamins: Secondary | ICD-10-CM

## 2012-12-26 DIAGNOSIS — D509 Iron deficiency anemia, unspecified: Secondary | ICD-10-CM

## 2012-12-26 DIAGNOSIS — D529 Folate deficiency anemia, unspecified: Secondary | ICD-10-CM

## 2012-12-26 LAB — CBC WITH DIFFERENTIAL (CANCER CENTER ONLY)
BASO%: 0.6 % (ref 0.0–2.0)
LYMPH%: 33.2 % (ref 14.0–48.0)
MCH: 28.2 pg (ref 26.0–34.0)
MCV: 85 fL (ref 81–101)
MONO%: 8.1 % (ref 0.0–13.0)
NEUT#: 2.9 10*3/uL (ref 1.5–6.5)
Platelets: 285 10*3/uL (ref 145–400)
RDW: 14.7 % (ref 11.1–15.7)
WBC: 5.3 10*3/uL (ref 3.9–10.0)

## 2012-12-26 NOTE — Progress Notes (Signed)
This office note has been dictated.

## 2012-12-27 ENCOUNTER — Other Ambulatory Visit: Payer: Self-pay | Admitting: Hematology & Oncology

## 2012-12-27 ENCOUNTER — Encounter: Payer: Self-pay | Admitting: Hematology & Oncology

## 2012-12-27 DIAGNOSIS — D509 Iron deficiency anemia, unspecified: Secondary | ICD-10-CM

## 2012-12-27 HISTORY — DX: Iron deficiency anemia, unspecified: D50.9

## 2012-12-27 LAB — VITAMIN B12: Vitamin B-12: 338 pg/mL (ref 211–911)

## 2012-12-27 LAB — LACTATE DEHYDROGENASE: LDH: 149 U/L (ref 94–250)

## 2012-12-28 ENCOUNTER — Telehealth: Payer: Self-pay | Admitting: Hematology & Oncology

## 2012-12-28 LAB — FOLATE RBC: RBC Folate: 2306 ng/mL (ref >=366)

## 2012-12-28 NOTE — Telephone Encounter (Signed)
Left pt message on home and cell to call for appointment. Did not give reason for appointment

## 2012-12-29 ENCOUNTER — Telehealth: Payer: Self-pay | Admitting: Hematology & Oncology

## 2012-12-29 NOTE — Telephone Encounter (Signed)
Patient called stating Katherine Bradford called and left a message on her vm to call and sch apt.  After researching, MD ordered for patient to be sch for an iron apt.  Patient asked to speak with a Rn.  Rn confirmed that an iron apt should be sch.  Apt was sch on 01/03/13.  i left message on patients voice mail of date and time of apt and to call the office back to confirm the apt

## 2012-12-30 ENCOUNTER — Telehealth: Payer: Self-pay | Admitting: Hematology & Oncology

## 2012-12-30 NOTE — Telephone Encounter (Signed)
Patient called to to reschedule to another date/time for her iron.

## 2013-01-03 ENCOUNTER — Ambulatory Visit: Payer: BC Managed Care – PPO

## 2013-01-03 ENCOUNTER — Telehealth: Payer: Self-pay | Admitting: Hematology & Oncology

## 2013-01-03 NOTE — Telephone Encounter (Signed)
Left message moved 10-1 time. Home and cell

## 2013-01-10 NOTE — Progress Notes (Signed)
CC:   Katherine Bradford. Katherine Chance, MD Katherine Bradford, M.D.  DIAGNOSES: 1. Pernicious anemia - intrinsic factor antibodies. 2. History of iron deficiency anemia. 3. Chronic gastric atrophy.  CURRENT THERAPY:  Vitamin B12 1 mg IM every month.  INTERIM HISTORY:  Katherine Bradford comes in for followup.  When we first saw her back in August, we found that she did have intrinsic factor antibodies.  She was negative for parietal cell antibodies. Unfortunately, there is no titer that was given.  She did have normal vitamin B12 level when we saw her.  She has been getting vitamin B12 weekly.  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 This goes up to her elbows.  Hopefully, this might be improved.  She is on folic acid 2 mg a day.  This hopefully is going to help with some neuropathic issues.  She occasional feels tired.  There has been no problem with bowels or bladder.  She did have an upper and lower endoscopy.  Upper endoscopy was done on the 20th.  Biopsy report ([id number]) showed chronic atrophic gastritis and associated intestinal metaplasia.  Overall, she seems to be doing pretty well.  She is eating well.  She is not a vegetarian.  She has had no nausea or vomiting.  There has been no change in bowel or bladder habits.  PHYSICAL EXAMINATION:  General:  This is a well-developed, well- nourished white female in no obvious distress.  Vital signs: Temperature of 98.1, pulse 71, respiratory rate 16, blood pressure 110/54.  Weight is 154.  Head and neck:  Normocephalic, atraumatic skull.  There are no ocular or oral lesions.  There are no palpable cervical or supraclavicular lymph nodes.  Lungs:  Clear bilaterally. Cardiac:  Regular rate and rhythm with a normal S1 and S2.  There are no murmurs, rubs or bruits.  Abdomen:  Soft.  She has good bowel sounds. There is no fluid wave.  There is no palpable hepatosplenomegaly.  Back: No tenderness over the spine, ribs, or hips.  Extremities:  Show  no clubbing, cyanosis or edema.  Neurological:  Shows no focal neurological deficits.  Skin:  No rashes, ecchymosis, or petechiae.  LABORATORY STUDIES:  White cell count is 5.3, hemoglobin 13.8, hematocrit 41.8, platelet count is 285. Her peripheral smear which I reviewed, showed no microcytic red cells.  There is no hypersegmented polys.  She has mature white cells.  She has no megaloblasts.  I see no rouleaux formation.  She has no teardrop cells.  IMPRESSION:  Katherine Bradford is a very charming 52 year old white female with pernicious anemia.  She has anti-intrinsic factor antibodies.  She is on vitamin B12 now.  She has chronic atrophic gastritis which certainly would be the etiology.  Of note, when we saw her back in August she was iron deficient.  We will see what her iron stores are now.  I will go ahead and plan to get her back to see me in another 3 months.  I think if her iron stores are still on the low side then she may need some IV iron.    ______________________________ Josph Macho, M.D. PRE/MEDQ  D:  12/26/2012  T:  01/10/2013  Job:  [id number]

## 2013-01-11 ENCOUNTER — Ambulatory Visit: Payer: BC Managed Care – PPO

## 2013-01-11 ENCOUNTER — Ambulatory Visit (HOSPITAL_BASED_OUTPATIENT_CLINIC_OR_DEPARTMENT_OTHER): Payer: BC Managed Care – PPO

## 2013-01-11 DIAGNOSIS — D510 Vitamin B12 deficiency anemia due to intrinsic factor deficiency: Secondary | ICD-10-CM

## 2013-01-11 DIAGNOSIS — D509 Iron deficiency anemia, unspecified: Secondary | ICD-10-CM

## 2013-01-11 MED ORDER — SODIUM CHLORIDE 0.9 % IV SOLN
1020.0000 mg | Freq: Once | INTRAVENOUS | Status: AC
  Administered 2013-01-11: 1020 mg via INTRAVENOUS
  Filled 2013-01-11: qty 34

## 2013-01-11 MED ORDER — CYANOCOBALAMIN 1000 MCG/ML IJ SOLN
INTRAMUSCULAR | Status: AC
  Filled 2013-01-11: qty 1

## 2013-01-11 MED ORDER — SODIUM CHLORIDE 0.9 % IV SOLN
Freq: Once | INTRAVENOUS | Status: AC
  Administered 2013-01-11: 14:00:00 via INTRAVENOUS

## 2013-01-11 MED ORDER — CYANOCOBALAMIN 1000 MCG/ML IJ SOLN
1000.0000 ug | Freq: Once | INTRAMUSCULAR | Status: AC
  Administered 2013-01-11: 1000 ug via INTRAMUSCULAR

## 2013-01-11 NOTE — Patient Instructions (Signed)
Ferumoxytol injection What is this medicine? FERUMOXYTOL is an iron complex. Iron is used to make healthy red blood cells, which carry oxygen and nutrients throughout the body. This medicine is used to treat iron deficiency anemia in people with chronic kidney disease. This medicine may be used for other purposes; ask your health care provider or pharmacist if you have questions. What should I tell my health care provider before I take this medicine? They need to know if you have any of these conditions: -anemia not caused by low iron levels -high levels of iron in the blood -magnetic resonance imaging (MRI) test scheduled -an unusual or allergic reaction to iron, other medicines, foods, dyes, or preservatives -pregnant or trying to get pregnant -breast-feeding How should I use this medicine? This medicine is for infusion into a vein. It is given by a health care professional in a hospital or clinic setting. Talk to your pediatrician regarding the use of this medicine in children. Special care may be needed. Overdosage: If you think you've taken too much of this medicine contact a poison control center or emergency room at once. Overdosage: If you think you have taken too much of this medicine contact a poison control center or emergency room at once. NOTE: This medicine is only for you. Do not share this medicine with others. What if I miss a dose? It is important not to miss your dose. Call your doctor or health care professional if you are unable to keep an appointment. What may interact with this medicine? This medicine may interact with the following medications: -other iron products This list may not describe all possible interactions. Give your health care provider a list of all the medicines, herbs, non-prescription drugs, or dietary supplements you use. Also tell them if you smoke, drink alcohol, or use illegal drugs. Some items may interact with your medicine. What should I watch  for while using this medicine? Visit your doctor or healthcare professional regularly. Tell your doctor or healthcare professional if your symptoms do not start to get better or if they get worse. You may need blood work done while you are taking this medicine. You may need to follow a special diet. Talk to your doctor. Foods that contain iron include: whole grains/cereals, dried fruits, beans, or peas, leafy green vegetables, and organ meats (liver, kidney). What side effects may I notice from receiving this medicine? Side effects that you should report to your doctor or health care professional as soon as possible: -allergic reactions like skin rash, itching or hives, swelling of the face, lips, or tongue -breathing problems -changes in blood pressure -feeling faint or lightheaded, falls -fever or chills -flushing, sweating, or hot feelings -swelling of the ankles or feet Side effects that usually do not require medical attention (Report these to your doctor or health care professional if they continue or are bothersome.): -diarrhea -headache -nausea, vomiting -stomach pain This list may not describe all possible side effects. Call your doctor for medical advice about side effects. You may report side effects to FDA at 1-800-FDA-1088. Where should I keep my medicine? This drug is given in a hospital or clinic and will not be stored at home. NOTE: This sheet is a summary. It may not cover all possible information. If you have questions about this medicine, talk to your doctor, pharmacist, or health care provider.  2012, Elsevier/Gold Standard. (12/21/2007 9:48:25 PM) 

## 2013-02-08 ENCOUNTER — Ambulatory Visit: Payer: BC Managed Care – PPO

## 2013-02-09 ENCOUNTER — Ambulatory Visit (HOSPITAL_BASED_OUTPATIENT_CLINIC_OR_DEPARTMENT_OTHER): Payer: BC Managed Care – PPO

## 2013-02-09 VITALS — BP 116/73 | HR 69 | Temp 98.3°F | Resp 20

## 2013-02-09 DIAGNOSIS — D510 Vitamin B12 deficiency anemia due to intrinsic factor deficiency: Secondary | ICD-10-CM

## 2013-02-09 DIAGNOSIS — D509 Iron deficiency anemia, unspecified: Secondary | ICD-10-CM

## 2013-02-09 MED ORDER — CYANOCOBALAMIN 1000 MCG/ML IJ SOLN
INTRAMUSCULAR | Status: AC
  Filled 2013-02-09: qty 1

## 2013-02-09 MED ORDER — CYANOCOBALAMIN 1000 MCG/ML IJ SOLN
1000.0000 ug | Freq: Once | INTRAMUSCULAR | Status: AC
  Administered 2013-02-09: 1000 ug via INTRAMUSCULAR

## 2013-02-09 NOTE — Patient Instructions (Signed)
Iron Deficiency Anemia There are many types of anemia. Iron deficiency anemia is the most common. Iron deficiency anemia is a decrease in the number of red blood cells caused by too little iron. Without enough iron, your body does not produce enough hemoglobin. Hemoglobin is a substance in red blood cells that carries oxygen to the body's tissues. Iron deficiency anemia may leave you tired and short of breath. CAUSES   Lack of iron in the diet.  This may be seen in infants and children, because there is little iron in milk.  This may be seen in adults who do not eat enough iron-rich foods.  This may be seen in pregnant or breastfeeding women who do not take iron supplements. There is a much higher need for iron intake at these times.  Poor absorption of iron, as seen with intestinal disorders.  Intestinal bleeding.  Heavy periods. SYMPTOMS  Mild anemia may not be noticeable. Symptoms may include:  Fatigue.  Headache.  Pale skin.  Weakness.  Shortness of breath.  Dizziness.  Cold hands and feet.  Fast or irregular heartbeat. DIAGNOSIS  Diagnosis requires a thorough evaluation and physical exam by your caregiver.  Blood tests are generally used to confirm iron deficiency anemia.  Additional tests may be done to find the underlying cause of your anemia. These may include:  Testing for blood in the stool (fecal occult blood test).  A procedure to see inside the colon and rectum (colonoscopy).  A procedure to see inside the esophagus and stomach (endoscopy). TREATMENT   Correcting the cause of the iron deficiency is the first step.  Medicines, such as oral contraceptives, can make heavy menstrual flows lighter.  Antibiotics and other medicines can be used to treat peptic ulcers.  Surgery may be needed to remove a bleeding polyp, tumor, or fibroid.  Often, iron supplements (ferrous sulfate) are taken.  For the best iron absorption, take these supplements with an  empty stomach.  You may need to take the supplements with food if you cannot tolerate them on an empty stomach. Vitamin C improves the absorption of iron. Your caregiver may recommend taking your iron tablets with a glass of orange juice or vitamin C supplement.  Milk and antacids should not be taken at the same time as iron supplements. They may interfere with the absorption of iron.  Iron supplements can cause constipation. A stool softener is often recommended.  Pregnant and breastfeeding women will need to take extra iron, because their normal diet usually will not provide the required amount.  Patients who cannot tolerate iron by mouth can take it through a vein (intravenously) or by an injection into the muscle. HOME CARE INSTRUCTIONS   Ask your dietitian for help with diet questions.  Take iron and vitamins as directed by your caregiver.  Eat a diet rich in iron. Eat liver, lean beef, whole-grain bread, eggs, dried fruit, and dark green leafy vegetables. SEEK IMMEDIATE MEDICAL CARE IF:   You have a fainting episode. Do not drive yourself. Call your local emergency services (911 in [place] if no other help is available.  You have chest pain, nausea, or vomiting.  You develop severe or increased shortness of breath with activities.  You develop weakness or increased thirst.  You have a rapid heartbeat.  You develop unexplained sweating or become lightheaded when getting up from a chair or bed. MAKE SURE YOU:   Understand these instructions.  Will watch your condition.  Will get help right away   if you are not doing well or get worse. Document Released: 03/27/2000 Document Revised: 06/22/2011 Document Reviewed: 08/06/2009 [facility] Patient Information 2014 [place], [place]. Cyanocobalamin, Vitamin B12 injection What is this medicine? CYANOCOBALAMIN (sye an oh koe BAL a min) is a man made form of vitamin B12. Vitamin B12 is used in the growth of healthy blood cells, nerve  cells, and proteins in the body. It also helps with the metabolism of fats and carbohydrates. This medicine is used to treat people who can not absorb vitamin B12. This medicine may be used for other purposes; ask your health care provider or pharmacist if you have questions. What should I tell my health care provider before I take this medicine? They need to know if you have any of these conditions: -kidney disease -Leber's disease -megaloblastic anemia -an unusual or allergic reaction to cyanocobalamin, cobalt, other medicines, foods, dyes, or preservatives -pregnant or trying to get pregnant -breast-feeding How should I use this medicine? This medicine is injected into a muscle or deeply under the skin. It is usually given by a health care professional in a clinic or doctor's office. However, your doctor may teach you how to inject yourself. Follow all instructions. Talk to your pediatrician regarding the use of this medicine in children. Special care may be needed. Overdosage: If you think you have taken too much of this medicine contact a poison control center or emergency room at once. NOTE: This medicine is only for you. Do not share this medicine with others. What if I miss a dose? If you are given your dose at a clinic or doctor's office, call to reschedule your appointment. If you give your own injections and you miss a dose, take it as soon as you can. If it is almost time for your next dose, take only that dose. Do not take double or extra doses. What may interact with this medicine? -colchicine -heavy alcohol intake This list may not describe all possible interactions. Give your health care provider a list of all the medicines, herbs, non-prescription drugs, or dietary supplements you use. Also tell them if you smoke, drink alcohol, or use illegal drugs. Some items may interact with your medicine. What should I watch for while using this medicine? Visit your doctor or health care  professional regularly. You may need blood work done while you are taking this medicine. You may need to follow a special diet. Talk to your doctor. Limit your alcohol intake and avoid smoking to get the best benefit. What side effects may I notice from receiving this medicine? Side effects that you should report to your doctor or health care professional as soon as possible: -allergic reactions like skin rash, itching or hives, swelling of the face, lips, or tongue -blue tint to skin -chest tightness, pain -difficulty breathing, wheezing -dizziness -red, swollen painful area on the leg Side effects that usually do not require medical attention (report to your doctor or health care professional if they continue or are bothersome): -diarrhea -headache This list may not describe all possible side effects. Call your doctor for medical advice about side effects. You may report side effects to FDA at 1-800-FDA-1088. Where should I keep my medicine? Keep out of the reach of children. Store at room temperature between 15 and 30 degrees C (59 and 85 degrees F). Protect from light. Throw away any unused medicine after the expiration date. NOTE: This sheet is a summary. It may not cover all possible information. If you have questions about  this medicine, talk to your doctor, pharmacist, or health care provider.  2012, Elsevier/Gold Standard. (07/11/2007 10:10:20 PM)

## 2013-02-13 ENCOUNTER — Ambulatory Visit: Payer: BC Managed Care – PPO

## 2013-03-08 ENCOUNTER — Other Ambulatory Visit: Payer: Self-pay

## 2013-03-08 DIAGNOSIS — Z1231 Encounter for screening mammogram for malignant neoplasm of breast: Secondary | ICD-10-CM

## 2013-03-10 ENCOUNTER — Ambulatory Visit (HOSPITAL_BASED_OUTPATIENT_CLINIC_OR_DEPARTMENT_OTHER): Payer: BC Managed Care – PPO

## 2013-03-10 VITALS — BP 123/72 | HR 70 | Temp 96.9°F | Resp 18

## 2013-03-10 DIAGNOSIS — E538 Deficiency of other specified B group vitamins: Secondary | ICD-10-CM

## 2013-03-10 DIAGNOSIS — D509 Iron deficiency anemia, unspecified: Secondary | ICD-10-CM

## 2013-03-10 MED ORDER — CYANOCOBALAMIN 1000 MCG/ML IJ SOLN
1000.0000 ug | Freq: Once | INTRAMUSCULAR | Status: AC
  Administered 2013-03-10: 1000 ug via INTRAMUSCULAR

## 2013-03-10 MED ORDER — CYANOCOBALAMIN 1000 MCG/ML IJ SOLN
INTRAMUSCULAR | Status: AC
  Filled 2013-03-10: qty 1

## 2013-03-10 NOTE — Patient Instructions (Signed)
Cyanocobalamin, Vitamin B12 injection °What is this medicine? °CYANOCOBALAMIN (sye an oh koe BAL a min) is a man made form of vitamin B12. Vitamin B12 is used in the growth of healthy blood cells, nerve cells, and proteins in the body. It also helps with the metabolism of fats and carbohydrates. This medicine is used to treat people who can not absorb vitamin B12. °This medicine may be used for other purposes; ask your health care provider or pharmacist if you have questions. °COMMON BRAND NAME(S): Cyomin, LA-12 , Nutri-Twelve , Primabalt °What should I tell my health care provider before I take this medicine? °They need to know if you have any of these conditions: °-kidney disease °-Leber's disease °-megaloblastic anemia °-an unusual or allergic reaction to cyanocobalamin, cobalt, other medicines, foods, dyes, or preservatives °-pregnant or trying to get pregnant °-breast-feeding °How should I use this medicine? °This medicine is injected into a muscle or deeply under the skin. It is usually given by a health care professional in a clinic or doctor's office. However, your doctor may teach you how to inject yourself. Follow all instructions. °Talk to your pediatrician regarding the use of this medicine in children. Special care may be needed. °Overdosage: If you think you have taken too much of this medicine contact a poison control center or emergency room at once. °NOTE: This medicine is only for you. Do not share this medicine with others. °What if I miss a dose? °If you are given your dose at a clinic or doctor's office, call to reschedule your appointment. If you give your own injections and you miss a dose, take it as soon as you can. If it is almost time for your next dose, take only that dose. Do not take double or extra doses. °What may interact with this medicine? °-colchicine °-heavy alcohol intake °This list may not describe all possible interactions. Give your health care provider a list of all the  medicines, herbs, non-prescription drugs, or dietary supplements you use. Also tell them if you smoke, drink alcohol, or use illegal drugs. Some items may interact with your medicine. °What should I watch for while using this medicine? °Visit your doctor or health care professional regularly. You may need blood work done while you are taking this medicine. °You may need to follow a special diet. Talk to your doctor. Limit your alcohol intake and avoid smoking to get the best benefit. °What side effects may I notice from receiving this medicine? °Side effects that you should report to your doctor or health care professional as soon as possible: °-allergic reactions like skin rash, itching or hives, swelling of the face, lips, or tongue °-blue tint to skin °-chest tightness, pain °-difficulty breathing, wheezing °-dizziness °-red, swollen painful area on the leg °Side effects that usually do not require medical attention (report to your doctor or health care professional if they continue or are bothersome): °-diarrhea °-headache °This list may not describe all possible side effects. Call your doctor for medical advice about side effects. You may report side effects to FDA at 1-800-FDA-1088. °Where should I keep my medicine? °Keep out of the reach of children. °Store at room temperature between 15 and 30 degrees C (59 and 85 degrees F). Protect from light. Throw away any unused medicine after the expiration date. °NOTE: This sheet is a summary. It may not cover all possible information. If you have questions about this medicine, talk to your doctor, pharmacist, or health care provider. °© 2014, [employer]vier/[employer] Standard. (2007-07-11   22:10:20) ° °

## 2013-03-20 NOTE — Progress Notes (Signed)
CC:   Katherine Bradford. [name], MD Katherine Bradford, M.D.  DIAGNOSES: 1. Pernicious anemia -- intrinsic factor antibodies. 2. History of iron-deficiency anemia. 3. Chronic gastric atrophy.  CURRENT THERAPY:  Vitamin B12 1 mg IM every month.  INTERIM HISTORY:  Katherine Bradford comes in for followup.  When we first saw her back in August, we found that she did have intrinsic factor antibodies.  She was negative for parietal cell antibodies. Unfortunately, there is no titer that was given.  She did have a normal vitamin B12 level when we saw her.  She has been getting vitamin B12 weekly.  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  This goes up to her elbows.  Hopefully, this might be improved. She is on folic acid 2 mg a day.  This hopefully is going to help with some neuropathic issues.  She occasionally feels tired.  There have been no problems with bowels or bladder.  She did have an upper and lower endoscopy.  Upper endoscopy was done on 20th.  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  Overall, she seems to be doing pretty well.  She is eating well.  She is not a vegetarian.  She has had no nausea or vomiting.  There has been no change in bowel or bladder habits.  PHYSICAL EXAMINATION:  General:  This is a well-developed, well- nourished white female, in no obvious distress.  Vital Signs: Temperature of 98.1, pulse 71, respiratory rate 16, blood pressure 110/54.  Weight is 154.  Head and Neck:  Normocephalic, atraumatic skull.  There are no ocular or oral lesions.  There are no palpable cervical or supraclavicular lymph nodes.  Lungs:  Clear bilaterally. Cardiac:  Regular rate and rhythm with a normal S1 and S2.  There are no murmurs, rubs, or bruits.  Abdomen:  Soft.  She has good bowel sounds. There is no fluid wave.  There is no palpable hepatosplenomegaly.  Back: No tenderness over the spine, ribs, or hips.  Extremities:  No  clubbing, cyanosis, or edema.  Neurologic:  No focal neurological deficits.  Skin: No rashes, ecchymoses, or petechiae.  LABORATORY STUDIES:  White cell count is 5.3, hemoglobin 13.8, hematocrit 41.8, platelet count is 285.  Her peripheral smear, which I reviewed, showed no microcytic red cells. There were no hypersegmented polys.  She has mature white cells.  She has no megaloblasts.  I see no rouleaux formation.  She has no teardrop cells.  IMPRESSION:  Katherine Bradford is a very charming 52 year old white female with pernicious anemia.  She has anti-intrinsic factor antibodies.  She is on vitamin B12 now.  She has chronic atrophic gastritis which certainly would be the etiology.  Of note, when we saw her back in August, she was iron deficient.  We will see what her iron stores are now.  I will go ahead and plan to get her back to see me in another 3 months.  I think if her iron stores are still on the low side, then she may need some IV iron.    ______________________________ Josph Macho, M.D. PRE/MEDQ  D:  12/26/2012  T:  03/19/2013  Job:  [id number]

## 2013-03-27 ENCOUNTER — Ambulatory Visit (HOSPITAL_BASED_OUTPATIENT_CLINIC_OR_DEPARTMENT_OTHER): Payer: BC Managed Care – PPO | Admitting: Hematology & Oncology

## 2013-03-27 ENCOUNTER — Telehealth: Payer: Self-pay | Admitting: Hematology & Oncology

## 2013-03-27 ENCOUNTER — Ambulatory Visit (HOSPITAL_BASED_OUTPATIENT_CLINIC_OR_DEPARTMENT_OTHER): Payer: BC Managed Care – PPO

## 2013-03-27 ENCOUNTER — Other Ambulatory Visit (HOSPITAL_BASED_OUTPATIENT_CLINIC_OR_DEPARTMENT_OTHER): Payer: BC Managed Care – PPO | Admitting: Lab

## 2013-03-27 VITALS — BP 129/54 | HR 79 | Temp 98.2°F | Resp 14 | Ht 64.0 in | Wt 147.0 lb

## 2013-03-27 DIAGNOSIS — D510 Vitamin B12 deficiency anemia due to intrinsic factor deficiency: Secondary | ICD-10-CM

## 2013-03-27 DIAGNOSIS — D509 Iron deficiency anemia, unspecified: Secondary | ICD-10-CM

## 2013-03-27 DIAGNOSIS — E538 Deficiency of other specified B group vitamins: Secondary | ICD-10-CM

## 2013-03-27 DIAGNOSIS — G5690 Unspecified mononeuropathy of unspecified upper limb: Secondary | ICD-10-CM

## 2013-03-27 DIAGNOSIS — R200 Anesthesia of skin: Secondary | ICD-10-CM

## 2013-03-27 LAB — CBC WITH DIFFERENTIAL (CANCER CENTER ONLY)
BASO%: 0.4 % (ref 0.0–2.0)
HCT: 44.9 % (ref 34.8–46.6)
LYMPH%: 37.1 % (ref 14.0–48.0)
MCH: 30.4 pg (ref 26.0–34.0)
MCHC: 34.3 g/dL (ref 32.0–36.0)
MCV: 89 fL (ref 81–101)
MONO#: 0.3 10*3/uL (ref 0.1–0.9)
MONO%: 5.8 % (ref 0.0–13.0)
NEUT%: 50.4 % (ref 39.6–80.0)
Platelets: 225 10*3/uL (ref 145–400)
RDW: 14 % (ref 11.1–15.7)
WBC: 4.5 10*3/uL (ref 3.9–10.0)

## 2013-03-27 LAB — RETICULOCYTES (CHCC)
ABS Retic: 46 10*3/uL (ref 19.0–186.0)
RBC.: 5.11 MIL/uL (ref 3.87–5.11)

## 2013-03-27 LAB — VITAMIN B12: Vitamin B-12: 315 pg/mL (ref 211–911)

## 2013-03-27 MED ORDER — ALTEPLASE 2 MG IJ SOLR
2.0000 mg | Freq: Once | INTRAMUSCULAR | Status: DC | PRN
  Filled 2013-03-27: qty 2

## 2013-03-27 MED ORDER — CYANOCOBALAMIN 1000 MCG/ML IJ SOLN
INTRAMUSCULAR | Status: AC
  Filled 2013-03-27: qty 1

## 2013-03-27 MED ORDER — CYANOCOBALAMIN 1000 MCG/ML IJ SOLN
1000.0000 ug | Freq: Once | INTRAMUSCULAR | Status: AC
  Administered 2013-03-27: 1000 ug via INTRAMUSCULAR

## 2013-03-27 NOTE — Telephone Encounter (Signed)
Mailed 06-2013 schedule °

## 2013-03-27 NOTE — Patient Instructions (Signed)
Cyanocobalamin, Vitamin B12 injection °What is this medicine? °CYANOCOBALAMIN (sye an oh koe BAL a min) is a man made form of vitamin B12. Vitamin B12 is used in the growth of healthy blood cells, nerve cells, and proteins in the body. It also helps with the metabolism of fats and carbohydrates. This medicine is used to treat people who can not absorb vitamin B12. °This medicine may be used for other purposes; ask your health care provider or pharmacist if you have questions. °COMMON BRAND NAME(S): Cyomin, LA-12 , Nutri-Twelve , Primabalt °What should I tell my health care provider before I take this medicine? °They need to know if you have any of these conditions: °-kidney disease °-Leber's disease °-megaloblastic anemia °-an unusual or allergic reaction to cyanocobalamin, cobalt, other medicines, foods, dyes, or preservatives °-pregnant or trying to get pregnant °-breast-feeding °How should I use this medicine? °This medicine is injected into a muscle or deeply under the skin. It is usually given by a health care professional in a clinic or doctor's office. However, your doctor may teach you how to inject yourself. Follow all instructions. °Talk to your pediatrician regarding the use of this medicine in children. Special care may be needed. °Overdosage: If you think you have taken too much of this medicine contact a poison control center or emergency room at once. °NOTE: This medicine is only for you. Do not share this medicine with others. °What if I miss a dose? °If you are given your dose at a clinic or doctor's office, call to reschedule your appointment. If you give your own injections and you miss a dose, take it as soon as you can. If it is almost time for your next dose, take only that dose. Do not take double or extra doses. °What may interact with this medicine? °-colchicine °-heavy alcohol intake °This list may not describe all possible interactions. Give your health care provider a list of all the  medicines, herbs, non-prescription drugs, or dietary supplements you use. Also tell them if you smoke, drink alcohol, or use illegal drugs. Some items may interact with your medicine. °What should I watch for while using this medicine? °Visit your doctor or health care professional regularly. You may need blood work done while you are taking this medicine. °You may need to follow a special diet. Talk to your doctor. Limit your alcohol intake and avoid smoking to get the best benefit. °What side effects may I notice from receiving this medicine? °Side effects that you should report to your doctor or health care professional as soon as possible: °-allergic reactions like skin rash, itching or hives, swelling of the face, lips, or tongue °-blue tint to skin °-chest tightness, pain °-difficulty breathing, wheezing °-dizziness °-red, swollen painful area on the leg °Side effects that usually do not require medical attention (report to your doctor or health care professional if they continue or are bothersome): °-diarrhea °-headache °This list may not describe all possible side effects. Call your doctor for medical advice about side effects. You may report side effects to FDA at 1-800-FDA-1088. °Where should I keep my medicine? °Keep out of the reach of children. °Store at room temperature between 15 and 30 degrees C (59 and 85 degrees F). Protect from light. Throw away any unused medicine after the expiration date. °NOTE: This sheet is a summary. It may not cover all possible information. If you have questions about this medicine, talk to your doctor, pharmacist, or health care provider. °© 2014, [employer]vier/[employer] Standard. (2007-07-11   22:10:20) ° °

## 2013-03-27 NOTE — Progress Notes (Signed)
This office note has been dictated.

## 2013-03-28 LAB — IRON AND TIBC CHCC
%SAT: 34 % (ref 21–57)
Iron: 80 ug/dL (ref 41–142)
UIBC: 157 ug/dL (ref 120–384)

## 2013-03-28 LAB — FERRITIN CHCC: Ferritin: 167 ng/ml (ref 9–269)

## 2013-03-28 NOTE — Progress Notes (Signed)
DIAGNOSES: 1. Pernicious anemia - anti-intrinsic factor antibodies. 2. Iron-deficiency anemia. 3. Chronic gastric atrophy.  CURRENT THERAPY: 1. Vitamin B12 1 mg IM q.3 months. 2. IV iron as indicated.  INTERIM HISTORY:  Ms. Dresner comes in for followup.  She is responding well with her lab work with B12 and iron replacement.  Her last B12 level was 338 back in September.  We are checking it again today.  She has gotten iron.  She has done very well with all this.  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  Her feet feel okay.  I am not sure as to why she still has the neuropathy.  One would think that this should be improved with B12 replaced.  However, I am not sure how long it takes for improvement to be noted.  She is still working.  She is having no problems with bleeding or bruising.  There has been no change in bowel or bladder habits.  PHYSICAL EXAMINATION:  General:  This is a well-developed, well- nourished white female, in no obvious distress.  Vital Signs: Temperature of 98.2, pulse 79, respiratory rate 14, blood pressure 129/54, weight is 147 pounds.  Head and Neck:  Normocephalic, atraumatic skull.  There are no ocular or oral lesions.  There are no palpable cervical or supraclavicular lymph nodes.  Lungs:  Clear bilaterally. Cardiac:  Regular rate and rhythm with a normal S1, S2.  There are no murmurs, rubs, or bruits.  Abdomen:  Soft.  She has good bowel sounds. There is no fluid wave.  There is no palpable hepatosplenomegaly.  Back: No tenderness over the spine, ribs, or hips.  Extremities:  No clubbing, cyanosis, or edema.  Neurologic:  No focal neurological deficits.  LABORATORY STUDIES:  White cell count 4.5, hemoglobin 15.4, hematocrit 44.9, platelet count 225.  MCV is 89.  IMPRESSION:  Ms. Bevacqua is a very charming 52 year old white female with pernicious anemia.  She has anti-intrinsic factor antibodies.  She is getting B12.  I think we will try to go  with B12 every 3 months now.  Again, I am surprised that she still has the neurological deficits.  I am not sure how long it takes to have these reversed.  I suppose that they may not reverse.  I will probably need to check her out for copper deficiency.  We also may consider zinc deficiency.  We will plan to get her back in 3 months' time now.  She sees her neurologist, I think, in January.  I do not see that we have to do any kind of scans on her right now.  I do not think she needs any scoping.    ______________________________ Josph Macho, M.D. PRE/MEDQ  D:  03/27/2013  T:  03/28/2013  Job:  [id number]

## 2013-03-31 ENCOUNTER — Telehealth: Payer: Self-pay | Admitting: *Deleted

## 2013-03-31 NOTE — Telephone Encounter (Signed)
Message copied by Anselm Jungling on Fri Mar 31, 2013  5:10 PM ------      Message from: Arlan Organ R      Created: Wed Mar 29, 2013  1:39 PM       Call - iron level is great!!!  Cindee Lame ------

## 2013-03-31 NOTE — Telephone Encounter (Signed)
Called patient to let her know that her iron levels were great per dr. Myna Hidalgo

## 2013-04-13 HISTORY — PX: CERVICAL FUSION: SHX112

## 2013-04-14 ENCOUNTER — Other Ambulatory Visit: Payer: Self-pay

## 2013-04-14 ENCOUNTER — Ambulatory Visit
Admission: RE | Admit: 2013-04-14 | Discharge: 2013-04-14 | Disposition: A | Discharge status: Home / Self Care | Payer: BC Managed Care – PPO | Source: Ambulatory Visit

## 2013-04-14 DIAGNOSIS — Z1231 Encounter for screening mammogram for malignant neoplasm of breast: Secondary | ICD-10-CM

## 2013-06-05 ENCOUNTER — Encounter: Payer: Self-pay | Admitting: Neurology

## 2013-06-05 ENCOUNTER — Ambulatory Visit (INDEPENDENT_AMBULATORY_CARE_PROVIDER_SITE_OTHER): Payer: BC Managed Care – PPO | Admitting: Neurology

## 2013-06-05 VITALS — BP 123/76 | HR 70 | Ht 64.0 in | Wt 154.0 lb

## 2013-06-05 DIAGNOSIS — D509 Iron deficiency anemia, unspecified: Secondary | ICD-10-CM

## 2013-06-05 DIAGNOSIS — R202 Paresthesia of skin: Secondary | ICD-10-CM

## 2013-06-05 DIAGNOSIS — R209 Unspecified disturbances of skin sensation: Secondary | ICD-10-CM

## 2013-06-05 DIAGNOSIS — E538 Deficiency of other specified B group vitamins: Secondary | ICD-10-CM

## 2013-06-05 DIAGNOSIS — E0500 Thyrotoxicosis with diffuse goiter without thyrotoxic crisis or storm: Secondary | ICD-10-CM

## 2013-06-05 DIAGNOSIS — R200 Anesthesia of skin: Secondary | ICD-10-CM

## 2013-06-05 NOTE — Progress Notes (Signed)
History of present illness:  Katherine Bradford is a 53 years old right-handed Caucasian female, referred to hand surgeon Dr. Daylene Katayama, and her primary care physician Dr. 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  She denies neck pain, no shooting pain from her neck to her arm, and hands,   She denies bilateral lower extremity paresthesia, no leg weakness, no incontinence,   UPDATE December 01 2012:  Evaluation showed extremely low B12 31, elevated methylmalonic acid level 16091, iron was low as well, 31, ferritin level 9, homocystine level was 53 point 7, positive intrinsic factor, antiparietal antibody, celiac workup was negative  EGD by Dr. Johna Roles, atrophic-appearing gastric mucosa consistent with the atrophic gastritis, biopsy was pending, colonoscopy was normal.  She was also evaluated by oncologist Dr.Ennerver, he will coordinate her care, and supplement, consider IV iron supplement.  MRI of cervical spine showed hazy T2 hyperintense signal in the posterior columns from C2 to C7-T1, consistent with B12 deficiency, degenerative joint disease of cervical spine  She still complains of bilateral hands burning, all five fingers, palm, numbness from elbow down, joints are stiff.  She still works full time, she drop things,  She denies memory loss, she complains of fatigue.  she denies gait difficulty, no bilateral lower extremity paresthesia  weakness, she denies incontinence   UPDATE Jun 05 2013:  In Oct 2014,she received iron infusion, she felt much better afterwards, there was no significant change with IM B12 supplement  She still have numbness and tingling in her hands, never her feet, mostly involving first 4 fingers, traveling along the volar side of her forearm, sometimes, she drop her dental tools,   Review of Systems  Out of a complete 14 system review, the patient complains of only the following symptoms, and all other reviewed systems are negative.     PHYSICAL EXAMINATOINS:  Generalized: In no acute distress  Neck: Supple, no carotid bruits   Cardiac: Regular rate rhythm  Pulmonary: Clear to auscultation bilaterally  Musculoskeletal: No deformity  Neurological examination  Mentation: Alert oriented to time, place, history taking, and causual conversation  Cranial nerve II-XII: Pupils were equal round reactive to light extraocular movements were full, visual field were full on confrontational test. facial sensation and strength were normal. hearing was intact to finger rubbing bilaterally. Uvula tongue midline.  head turning and shoulder shrug and were normal and symmetric.Tongue protrusion into cheek strength was normal.  Motor: normal tone, bulk and strength.  Sensory: hypersensitivity of bilateral finger tips,  preserved vibratory sensation, and proprioception at toes and fingertips,  she also has decreased pinprick at first four fingerpads, .  Coordination: Normal finger to nose, heel-to-shin bilaterally there was no truncal ataxia  Gait: Rising up from seated position without assistance, normal stance, without trunk ataxia, moderate stride, good arm swing, smooth turning, able to perform tiptoe, and heel walking without difficulty.   Romberg signs: Negative  Deep tendon reflexes: Brachioradialis 2/2, biceps 2/2, triceps 2/2, patellar 2/2,  Achilles 2/2, plantar responses were flexor  bilaterally.  Assessment and plan:  53 years old right-handed Caucasian female, with past medical history of Graves' disease, anemia, now presenting with bilateral fingertips hypersensitivity,  extensive evaluation has demonstrated Vitamin B12 deficiency, also with positive antiparietal antibody, positive intrinsic factor, EGD consistent with pernicious anemia, she work as a Art therapist, has occasionally contact with nitrous oxide which can also cause B12 deficiency,   1. continue vitamin M62, folic acid, iron supplement,. 2. Cooper, zinc levels pain and by her oncologist Dr.  Martha Clan 3.  repeat EMG nerve conduction study to rule out carpal tunnel syndromes, wrist splint

## 2013-06-23 ENCOUNTER — Encounter (INDEPENDENT_AMBULATORY_CARE_PROVIDER_SITE_OTHER): Payer: Self-pay

## 2013-06-23 ENCOUNTER — Ambulatory Visit (INDEPENDENT_AMBULATORY_CARE_PROVIDER_SITE_OTHER): Payer: BC Managed Care – PPO | Admitting: Neurology

## 2013-06-23 DIAGNOSIS — R209 Unspecified disturbances of skin sensation: Secondary | ICD-10-CM

## 2013-06-23 DIAGNOSIS — E538 Deficiency of other specified B group vitamins: Secondary | ICD-10-CM

## 2013-06-23 DIAGNOSIS — E0500 Thyrotoxicosis with diffuse goiter without thyrotoxic crisis or storm: Secondary | ICD-10-CM

## 2013-06-23 DIAGNOSIS — D509 Iron deficiency anemia, unspecified: Secondary | ICD-10-CM

## 2013-06-23 DIAGNOSIS — Z0289 Encounter for other administrative examinations: Secondary | ICD-10-CM

## 2013-06-23 DIAGNOSIS — R202 Paresthesia of skin: Secondary | ICD-10-CM

## 2013-06-23 DIAGNOSIS — R200 Anesthesia of skin: Secondary | ICD-10-CM

## 2013-06-23 NOTE — Procedures (Signed)
   NCS (NERVE CONDUCTION STUDY) WITH EMG (ELECTROMYOGRAPHY) REPORT   STUDY DATE: March 13th 2015 PATIENT NAME: Katherine Bradford DOB: March 11, 1961 MRN: [id number]    TECHNOLOGIST: Laretta Alstrom ELECTROMYOGRAPHER: Marcial Pacas M.D.  CLINICAL INFORMATION: 53 years old Caucasian female, with pernicious anemia, vitamin B12 deficiency, evidence of cervical dorsum column lesions, continue to complain of bilateral fingers paresthesia  FINDINGS: NERVE CONDUCTION STUDY: Bilateral median, ulnar sensory and motor responses were normal  NEEDLE ELECTROMYOGRAPHY: Selected needle examination was performed at right upper extremity muscles, and the right cervical paraspinal muscles  Needle examination of right abductor pollicis brevis, extensor digitorum communis, biceps triceps deltoid was normal  IMPRESSION:   This is a normal study. There is no electrodiagnostic evidence of right upper extremity neuropathy. In specific, there is no evidence of bilateral carpal tunnel syndromes. There is no evidence of right cervical radiculopathy.   INTERPRETING PHYSICIAN:   Marcial Pacas M.D. Ph.D. [facility] [address], [place], [place] [address] [phone]

## 2013-06-26 ENCOUNTER — Ambulatory Visit (HOSPITAL_BASED_OUTPATIENT_CLINIC_OR_DEPARTMENT_OTHER): Payer: BC Managed Care – PPO | Admitting: Hematology & Oncology

## 2013-06-26 ENCOUNTER — Other Ambulatory Visit (HOSPITAL_BASED_OUTPATIENT_CLINIC_OR_DEPARTMENT_OTHER): Payer: BC Managed Care – PPO | Admitting: Lab

## 2013-06-26 ENCOUNTER — Ambulatory Visit (HOSPITAL_BASED_OUTPATIENT_CLINIC_OR_DEPARTMENT_OTHER): Payer: BC Managed Care – PPO

## 2013-06-26 ENCOUNTER — Encounter: Payer: Self-pay | Admitting: Hematology & Oncology

## 2013-06-26 VITALS — BP 118/70 | HR 82 | Temp 98.1°F | Resp 20 | Wt 158.0 lb

## 2013-06-26 DIAGNOSIS — D509 Iron deficiency anemia, unspecified: Secondary | ICD-10-CM

## 2013-06-26 DIAGNOSIS — R202 Paresthesia of skin: Secondary | ICD-10-CM

## 2013-06-26 DIAGNOSIS — R200 Anesthesia of skin: Secondary | ICD-10-CM

## 2013-06-26 DIAGNOSIS — K2940 Chronic atrophic gastritis without bleeding: Secondary | ICD-10-CM

## 2013-06-26 DIAGNOSIS — E538 Deficiency of other specified B group vitamins: Secondary | ICD-10-CM

## 2013-06-26 DIAGNOSIS — D510 Vitamin B12 deficiency anemia due to intrinsic factor deficiency: Secondary | ICD-10-CM

## 2013-06-26 LAB — CHCC SATELLITE - SMEAR

## 2013-06-26 LAB — CBC WITH DIFFERENTIAL (CANCER CENTER ONLY)
BASO#: 0 10*3/uL (ref 0.0–0.2)
BASO%: 0.4 % (ref 0.0–2.0)
EOS%: 3.5 % (ref 0.0–7.0)
Eosinophils Absolute: 0.3 10*3/uL (ref 0.0–0.5)
HCT: 44.6 % (ref 34.8–46.6)
HGB: 15.1 g/dL (ref 11.6–15.9)
LYMPH#: 1.9 10*3/uL (ref 0.9–3.3)
LYMPH%: 26.8 % (ref 14.0–48.0)
MCH: 30.9 pg (ref 26.0–34.0)
MCHC: 33.9 g/dL (ref 32.0–36.0)
MCV: 91 fL (ref 81–101)
MONO#: 0.4 10*3/uL (ref 0.1–0.9)
MONO%: 4.9 % (ref 0.0–13.0)
NEUT#: 4.6 10*3/uL (ref 1.5–6.5)
NEUT%: 64.4 % (ref 39.6–80.0)
PLATELETS: 238 10*3/uL (ref 145–400)
RBC: 4.89 10*6/uL (ref 3.70–5.32)
RDW: 12.1 % (ref 11.1–15.7)
WBC: 7.2 10*3/uL (ref 3.9–10.0)

## 2013-06-26 MED ORDER — CYANOCOBALAMIN 1000 MCG/ML IJ SOLN
1000.0000 ug | Freq: Once | INTRAMUSCULAR | Status: AC
  Administered 2013-06-26: 1000 ug via INTRAMUSCULAR

## 2013-06-26 MED ORDER — CYANOCOBALAMIN 1000 MCG/ML IJ SOLN
INTRAMUSCULAR | Status: AC
  Filled 2013-06-26: qty 1

## 2013-06-26 NOTE — Patient Instructions (Signed)
Vitamin B12 Deficiency Not having enough vitamin B12 is called a deficiency. Your body needs this vitamin for important body functions. HOME CARE  Take all vitamins, herbs, or nutrition drinks (supplements) as told by your doctor.  Get shots (injections) as told. Do not miss your doctor visit.  Eat foods than contain vitamin B12. This includes:  Meat.  Chicken, turkey, or other birds (poultry).  Fish.  Eggs.  Cereals or milk with added vitamin B12. Check the label.  Do not drink too much (abuse) alcohol.  Keep all doctor visits as told. GET HELP IF:  You have questions.  Your problems come back. MAKE SURE YOU:  Understand these instructions.  Will watch your condition.  Will get help right away if you are not doing well or get worse. Document Released: 03/19/2011 Document Revised: 06/22/2011 Document Reviewed: 03/19/2011 Exit[employer] Patient Information 2014 [employer].  

## 2013-06-26 NOTE — Progress Notes (Signed)
Hematology and Oncology Follow Up Visit  Katherine Bradford [id number] 10-05-1960 53 y.o. 06/26/2013   Principle Diagnosis:   Pernicious anemia-anti-intrinsic factor antibodies  Iron deficiency anemia-malabsorption  Chronic gastric atrophy  Current Therapy:    Vitamin B12 1 mg IM every 3 months  IV iron as indicated     Interim History:  Ms.  Katherine Bradford is back for followup. We last saw her back in December. She is chronic gastric atrophy. She has intrinsic factor and a bodies.  She is on B12 replacement. She is a well this. Unfortunately, she still has the tingling and numbness in the hands and arms. I don't know if this is of a permanent result of a B12 deficiency. We certainly have replaced her B12 levels. Once every 3 month is appropriate.  Her last iron studies in December showed a ferritin of 167. Iron saturation is 34%.  There is no problems with her feet. She saw her neurologist. Nerve conduction studies and EMG studies were done. There is no carpal tunnel. Her neurologist thinks this may be coming from her neck. No MRI has in order to.  She still working. She is a Copywriter, advertising.  I want to check her for zinc deficiency and also Wilson's disease.       Medications: Current outpatient prescriptions:Ascorbic Acid (VITAMIN C) 500 MG CAPS, Take by mouth every morning., Disp: , Rfl: ;  cholecalciferol (VITAMIN D) 1000 UNITS tablet, Take 2,000 Units by mouth daily., Disp: , Rfl: ;  cyanocobalamin (,VITAMIN B-12,) 1000 MCG/ML injection, Inject 1 mL (1,000 mcg total) into the muscle once., Disp: 1 mL, Rfl: 12;  Efinaconazole (JUBLIA EX), Apply topically as needed., Disp: , Rfl:  folic acid (FOLVITE) 1 MG tablet, Take 2 mg by mouth daily., Disp: , Rfl: ;  vitamin E 100 UNIT capsule, Take 200 Units by mouth daily., Disp: , Rfl:   Allergies:  Allergies  Allergen Reactions  . Other     narcotics  . Prednisone Other (See Comments)    Patient prefers not to take due to the edginess  it gives her  . Epinephrine Palpitations  . Keflex [Cephalexin] Rash    Past Medical History, Surgical history, Social history, and Family History were reviewed and updated.  Review of Systems:  as above   Physical Exam:  weight is 158 lb (71.668 kg). Her temperature is 98.1 F (36.7 C). Her blood pressure is 118/70 and her pulse is 82. Her respiration is 20.    well-developed well-nourished white female. Vital signs are above. No head and neck lesions are noted. No adenopathy. Thyroid is nonpalpable. Lungs are clear. Cardiac exam regular rate and rhythm with no murmurs rubs or bruits. Abdomen is soft. She's good bowel sounds. There is no fluid wave. There is no palpable hepato- splenomegaly. Back exam no tenderness over the spine ribs or hips. There may be some slight tenderness at the base of the skull. No erythema or swelling is noted. Extremities shows good strength in upper or lower extremities. She has intact sensation-wise. Skin exam no rashes. Neurological exam shows no obvious neurological deficits.  Lab Results  Component Value Date   WBC 7.2 06/26/2013   HGB 15.1 06/26/2013   HCT 44.6 06/26/2013   MCV 91 06/26/2013   PLT 238 06/26/2013     Chemistry      Component Value Date/Time   NA 141 09/21/2012 0920   K 4.3 09/21/2012 0920   CL 104 09/21/2012 0920   CO2 25  09/21/2012 0920   BUN 12 09/21/2012 0920   CREATININE 0.78 09/21/2012 0920      Component Value Date/Time   CALCIUM 9.2 09/21/2012 0920   ALKPHOS 79 09/21/2012 0920   AST 16 09/21/2012 0920   ALT 15 09/21/2012 0920   BILITOT 0.5 09/21/2012 0920         Impression and Plan: Ms. Katherine Bradford is 53 year old white female. She has pernicious anemia. His gastric atrophy.  She asked me about the risk of stomach cancer. She is at risk for stomach cancer. I this risk is pretty low however.  We will get her back to have a studies done for zinc deficiency and Wilson's disease. She bodies to have a 24-hour urine done.  I don't  think that a bone marrow biopsy as needed. I don't see that any scans need to be done.  Is possible that she has permanent neurologic issues from the pernicious anemia and add Unasyn to her hematologic parameters have resolved, but she still may have p ermanent neurologic issues.  Likely will see her back in another 3 months.    Volanda Napoleon, MD 3/16/20156:31 PM

## 2013-06-27 ENCOUNTER — Telehealth: Payer: Self-pay | Admitting: Hematology & Oncology

## 2013-06-27 LAB — FERRITIN CHCC: Ferritin: 77 ng/ml (ref 9–269)

## 2013-06-27 LAB — IRON AND TIBC CHCC
%SAT: 28 % (ref 21–57)
Iron: 81 ug/dL (ref 41–142)
TIBC: 291 ug/dL (ref 236–444)
UIBC: 210 ug/dL (ref 120–384)

## 2013-06-27 NOTE — Telephone Encounter (Signed)
Pt aware of 3-20 lab and 3-27 MRI and to get schedule on friday

## 2013-06-30 ENCOUNTER — Other Ambulatory Visit (HOSPITAL_BASED_OUTPATIENT_CLINIC_OR_DEPARTMENT_OTHER): Payer: BC Managed Care – PPO | Admitting: Lab

## 2013-06-30 DIAGNOSIS — R200 Anesthesia of skin: Secondary | ICD-10-CM

## 2013-06-30 DIAGNOSIS — D509 Iron deficiency anemia, unspecified: Secondary | ICD-10-CM

## 2013-06-30 DIAGNOSIS — R202 Paresthesia of skin: Secondary | ICD-10-CM

## 2013-06-30 LAB — ZINC: Zinc: 65 ug/dL (ref 60–130)

## 2013-06-30 LAB — RETICULOCYTES (CHCC)
ABS Retic: 54.2 10*3/uL (ref 19.0–186.0)
RBC.: 4.93 MIL/uL (ref 3.87–5.11)
Retic Ct Pct: 1.1 % (ref 0.4–2.3)

## 2013-06-30 LAB — VITAMIN B12: VITAMIN B 12: 180 pg/mL — AB (ref 211–911)

## 2013-06-30 LAB — COPPER, SERUM: Copper: 118 ug/dL (ref 70–175)

## 2013-07-03 ENCOUNTER — Encounter: Payer: Self-pay | Admitting: *Deleted

## 2013-07-03 NOTE — Progress Notes (Signed)
Pt called to report she was suppose to collect a 24hr urine for copper starting yesterday but she started her menstrual cycle and is asking if she should wait till that finishes before she does the test.  Rosann Auerbach from the lab said it would interfer with the test.  Pt will collect when she finishes.  Dr Marin Olp made aware.

## 2013-07-04 ENCOUNTER — Telehealth: Payer: Self-pay | Admitting: *Deleted

## 2013-07-04 ENCOUNTER — Telehealth: Payer: Self-pay | Admitting: Oncology

## 2013-07-04 ENCOUNTER — Other Ambulatory Visit: Payer: Self-pay | Admitting: Oncology

## 2013-07-04 DIAGNOSIS — D509 Iron deficiency anemia, unspecified: Secondary | ICD-10-CM

## 2013-07-04 NOTE — Telephone Encounter (Addendum)
Message copied by Cottie Banda on Tue Jul 04, 2013  1:30 PM ------      Message from: Burney Gauze R      Created: Thu Jun 29, 2013  6:51 AM       Call - iron is going down!!  Need IV Ferheme 1020mg  x 1 dose.  Set up for Friday!!!!  Pete ------Left voicemail message. Will put orders in signed and held.

## 2013-07-04 NOTE — Telephone Encounter (Addendum)
Message copied by Lenn Sink on Tue Jul 04, 2013  2:37 PM ------      Message from: Volanda Napoleon      Created: Fri Jun 30, 2013  6:25 AM       Call - copper and zinc levels are ok!!  Laurey Arrow ------Left a voicemail informing patient that her copper and zinc levels are okay.

## 2013-07-05 ENCOUNTER — Other Ambulatory Visit: Payer: Self-pay | Admitting: *Deleted

## 2013-07-05 ENCOUNTER — Telehealth: Payer: Self-pay | Admitting: Hematology & Oncology

## 2013-07-05 DIAGNOSIS — E610 Copper deficiency: Secondary | ICD-10-CM

## 2013-07-05 LAB — CERULOPLASMIN: CERULOPLASMIN: 30 mg/dL (ref 18–53)

## 2013-07-05 NOTE — Telephone Encounter (Signed)
error 

## 2013-07-05 NOTE — Telephone Encounter (Signed)
Pt aware of 4-3 iron and is going to bring in 24 hour urine on monday

## 2013-07-07 ENCOUNTER — Ambulatory Visit
Admission: RE | Admit: 2013-07-07 | Discharge: 2013-07-07 | Disposition: A | Discharge status: Home / Self Care | Payer: BC Managed Care – PPO | Source: Ambulatory Visit | Attending: Hematology & Oncology | Admitting: Hematology & Oncology

## 2013-07-07 DIAGNOSIS — R200 Anesthesia of skin: Secondary | ICD-10-CM

## 2013-07-07 DIAGNOSIS — R202 Paresthesia of skin: Secondary | ICD-10-CM

## 2013-07-07 MED ORDER — GADOBENATE DIMEGLUMINE 529 MG/ML IV SOLN
14.0000 mL | Freq: Once | INTRAVENOUS | Status: AC | PRN
  Administered 2013-07-07: 14 mL via INTRAVENOUS

## 2013-07-14 ENCOUNTER — Ambulatory Visit: Payer: BC Managed Care – PPO

## 2013-08-10 ENCOUNTER — Other Ambulatory Visit: Payer: Self-pay | Admitting: *Deleted

## 2013-08-10 DIAGNOSIS — D509 Iron deficiency anemia, unspecified: Secondary | ICD-10-CM

## 2013-08-11 ENCOUNTER — Ambulatory Visit: Payer: BC Managed Care – PPO

## 2013-08-11 ENCOUNTER — Ambulatory Visit (HOSPITAL_BASED_OUTPATIENT_CLINIC_OR_DEPARTMENT_OTHER): Payer: BC Managed Care – PPO | Admitting: Hematology & Oncology

## 2013-08-11 ENCOUNTER — Encounter: Payer: Self-pay | Admitting: Hematology & Oncology

## 2013-08-11 ENCOUNTER — Ambulatory Visit (HOSPITAL_BASED_OUTPATIENT_CLINIC_OR_DEPARTMENT_OTHER): Payer: BC Managed Care – PPO

## 2013-08-11 ENCOUNTER — Other Ambulatory Visit (HOSPITAL_BASED_OUTPATIENT_CLINIC_OR_DEPARTMENT_OTHER): Payer: BC Managed Care – PPO | Admitting: Lab

## 2013-08-11 VITALS — BP 120/69 | HR 76 | Temp 97.8°F | Resp 14 | Ht 65.0 in | Wt 151.0 lb

## 2013-08-11 DIAGNOSIS — G5690 Unspecified mononeuropathy of unspecified upper limb: Secondary | ICD-10-CM

## 2013-08-11 DIAGNOSIS — E538 Deficiency of other specified B group vitamins: Secondary | ICD-10-CM

## 2013-08-11 DIAGNOSIS — D510 Vitamin B12 deficiency anemia due to intrinsic factor deficiency: Secondary | ICD-10-CM

## 2013-08-11 DIAGNOSIS — K2940 Chronic atrophic gastritis without bleeding: Secondary | ICD-10-CM

## 2013-08-11 DIAGNOSIS — D509 Iron deficiency anemia, unspecified: Secondary | ICD-10-CM

## 2013-08-11 DIAGNOSIS — K9089 Other intestinal malabsorption: Secondary | ICD-10-CM

## 2013-08-11 LAB — CBC WITH DIFFERENTIAL (CANCER CENTER ONLY)
BASO#: 0 10*3/uL (ref 0.0–0.2)
BASO%: 0.4 % (ref 0.0–2.0)
EOS%: 3.6 % (ref 0.0–7.0)
Eosinophils Absolute: 0.2 10*3/uL (ref 0.0–0.5)
HCT: 44.7 % (ref 34.8–46.6)
HGB: 15.7 g/dL (ref 11.6–15.9)
LYMPH#: 1.9 10*3/uL (ref 0.9–3.3)
LYMPH%: 40.2 % (ref 14.0–48.0)
MCH: 31.6 pg (ref 26.0–34.0)
MCHC: 35.1 g/dL (ref 32.0–36.0)
MCV: 90 fL (ref 81–101)
MONO#: 0.3 10*3/uL (ref 0.1–0.9)
MONO%: 5.7 % (ref 0.0–13.0)
NEUT%: 50.1 % (ref 39.6–80.0)
NEUTROS ABS: 2.4 10*3/uL (ref 1.5–6.5)
PLATELETS: 231 10*3/uL (ref 145–400)
RBC: 4.97 10*6/uL (ref 3.70–5.32)
RDW: 12 % (ref 11.1–15.7)
WBC: 4.7 10*3/uL (ref 3.9–10.0)

## 2013-08-11 LAB — IRON AND TIBC CHCC
%SAT: 43 % (ref 21–57)
IRON: 115 ug/dL (ref 41–142)
TIBC: 265 ug/dL (ref 236–444)
UIBC: 150 ug/dL (ref 120–384)

## 2013-08-11 LAB — FERRITIN CHCC: Ferritin: 90 ng/ml (ref 9–269)

## 2013-08-11 MED ORDER — CYANOCOBALAMIN 1000 MCG/ML IJ SOLN
INTRAMUSCULAR | Status: AC
  Filled 2013-08-11: qty 1

## 2013-08-11 MED ORDER — CYANOCOBALAMIN 1000 MCG/ML IJ SOLN
1000.0000 ug | Freq: Once | INTRAMUSCULAR | Status: AC
  Administered 2013-08-11: 1000 ug via INTRAMUSCULAR

## 2013-08-11 NOTE — Patient Instructions (Signed)
Cyanocobalamin, Vitamin B12 injection °What is this medicine? °CYANOCOBALAMIN (sye an oh koe BAL a min) is a man made form of vitamin B12. Vitamin B12 is used in the growth of healthy blood cells, nerve cells, and proteins in the body. It also helps with the metabolism of fats and carbohydrates. This medicine is used to treat people who can not absorb vitamin B12. °This medicine may be used for other purposes; ask your health care provider or pharmacist if you have questions. °COMMON BRAND NAME(S): Cyomin, LA-12 , Nutri-Twelve , Primabalt °What should I tell my health care provider before I take this medicine? °They need to know if you have any of these conditions: °-kidney disease °-Leber's disease °-megaloblastic anemia °-an unusual or allergic reaction to cyanocobalamin, cobalt, other medicines, foods, dyes, or preservatives °-pregnant or trying to get pregnant °-breast-feeding °How should I use this medicine? °This medicine is injected into a muscle or deeply under the skin. It is usually given by a health care professional in a clinic or doctor's office. However, your doctor may teach you how to inject yourself. Follow all instructions. °Talk to your pediatrician regarding the use of this medicine in children. Special care may be needed. °Overdosage: If you think you have taken too much of this medicine contact a poison control center or emergency room at once. °NOTE: This medicine is only for you. Do not share this medicine with others. °What if I miss a dose? °If you are given your dose at a clinic or doctor's office, call to reschedule your appointment. If you give your own injections and you miss a dose, take it as soon as you can. If it is almost time for your next dose, take only that dose. Do not take double or extra doses. °What may interact with this medicine? °-colchicine °-heavy alcohol intake °This list may not describe all possible interactions. Give your health care provider a list of all the  medicines, herbs, non-prescription drugs, or dietary supplements you use. Also tell them if you smoke, drink alcohol, or use illegal drugs. Some items may interact with your medicine. °What should I watch for while using this medicine? °Visit your doctor or health care professional regularly. You may need blood work done while you are taking this medicine. °You may need to follow a special diet. Talk to your doctor. Limit your alcohol intake and avoid smoking to get the best benefit. °What side effects may I notice from receiving this medicine? °Side effects that you should report to your doctor or health care professional as soon as possible: °-allergic reactions like skin rash, itching or hives, swelling of the face, lips, or tongue °-blue tint to skin °-chest tightness, pain °-difficulty breathing, wheezing °-dizziness °-red, swollen painful area on the leg °Side effects that usually do not require medical attention (report to your doctor or health care professional if they continue or are bothersome): °-diarrhea °-headache °This list may not describe all possible side effects. Call your doctor for medical advice about side effects. You may report side effects to FDA at 1-800-FDA-1088. °Where should I keep my medicine? °Keep out of the reach of children. °Store at room temperature between 15 and 30 degrees C (59 and 85 degrees F). Protect from light. Throw away any unused medicine after the expiration date. °NOTE: This sheet is a summary. It may not cover all possible information. If you have questions about this medicine, talk to your doctor, pharmacist, or health care provider. °© 2014, [employer]vier/[employer] Standard. (2007-07-11   22:10:20) ° °

## 2013-08-11 NOTE — Progress Notes (Signed)
Hematology and Oncology Follow Up Visit  Katherine Bradford [id number] March 22, 1961 53 y.o. 08/11/2013   Principle Diagnosis:   Pernicious anemia-anti-intrinsic factor antibodies  Iron deficiency anemia-malabsorption  Chronic gastric atrophy  Current Therapy:    Vitamin B12 1 mg IM every 3 months  IV iron as indicated     Interim History:  [name] is back for followup. We'll last saw her, I thought that there is something else going on with her that was causing her neuropathy. We got an MRI of her cervical spine. 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  We got her to orthopedic surgery. She underwent surgery with spinal fusion. Apparently, she did not have any complications. 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 This hopefully is getting better.  She still has the B12 that is an low. She's getting supplementation.  We last saw her, prior study looked okay. Her ferritin was 77 iron saturation 28%. Medications: Current outpatient prescriptions:Acetaminophen (TYLENOL EXTRA STRENGTH PO), Take 2 tablets by mouth 3 (three) times daily., Disp: , Rfl: ;  Ascorbic Acid (VITAMIN C) 500 MG CAPS, Take by mouth every morning., Disp: , Rfl: ;  cholecalciferol (VITAMIN D) 1000 UNITS tablet, Take 2,000 Units by mouth daily., Disp: , Rfl: ;  cyanocobalamin (,VITAMIN B-12,) 1000 MCG/ML injection, Inject 1,000 mcg into the muscle every 30 (thirty) days., Disp: , Rfl:  Efinaconazole (JUBLIA EX), Apply topically as needed., Disp: , Rfl: ;  folic acid (FOLVITE) 1 MG tablet, Take 2 mg by mouth daily., Disp: , Rfl: ;  Methocarbamol (ROBAXIN PO), Take 500 mg by mouth 4 (four) times daily as needed., Disp: , Rfl: ;  vitamin E 100 UNIT capsule, Take 200 Units by mouth daily., Disp: , Rfl:   Allergies:  Allergies  Allergen Reactions  . Vancomycin     Red neck syndrome from IV Vanco.  . Other     narcotics  . Prednisone Other (See Comments)    Patient prefers not to take  due to the edginess it gives her  . Epinephrine Palpitations  . Keflex [Cephalexin] Rash    Past Medical History, Surgical history, Social history, and Family History were reviewed and updated.  Review of Systems: As above  Physical Exam:  height is 5\' 5"  (1.651 m) and weight is 151 lb (68.493 kg). Her oral temperature is 97.8 F (36.6 C). Her blood pressure is 120/69 and her pulse is 76. Her respiration is 14.   Well-developed well-nourished white female. Head and neck exam shows no ocular or oral lesions. She has been cervical discectomy scar in the left lower neck that is healing. There are no palpable cervical or supraclavicular lymph nodes. Lungs are clear. Cardiac exam regular in rhythm. Abdomen is soft. Has good bowel sounds. There is no fluid wave. There is no palpable liver or spleen tip. Back exam no tenderness over the spine ribs or hips. Extremities shows no clubbing cyanosis or edema. She has decent strength in her hands. Has good range of motion of her joints. Skin exam no rashes.  Lab Results  Component Value Date   WBC 4.7 08/11/2013   HGB 15.7 08/11/2013   HCT 44.7 08/11/2013   MCV 90 08/11/2013   PLT 231 08/11/2013     Chemistry      Component Value Date/Time   NA 141 09/21/2012 0920   K 4.3 09/21/2012 0920   CL 104 09/21/2012 0920   CO2 25 09/21/2012 0920  BUN 12 09/21/2012 0920   CREATININE 0.78 09/21/2012 0920      Component Value Date/Time   CALCIUM 9.2 09/21/2012 0920   ALKPHOS 79 09/21/2012 0920   AST 16 09/21/2012 0920   ALT 15 09/21/2012 0920   BILITOT 0.5 09/21/2012 0920      Vitamin B12 is 196. Ferritin 90. Iron saturation 43%.   Impression and Plan: Katherine Bradford is 53 year old white female. She has pernicious anemia. She does have anti-intrinsic factor antibodies. She also has chronic gastric atrophy. We will give her be called today.  I don't appreciate any iron right now.  I am glad that we were able to get her to surgery so she can have this spinal issue  taken care of. She is very appreciative of the efforts by orthopedic surgery.  I think we can probably get her back in about 6 weeks or so. I want to make sure we continue to give her the B12.   Volanda Napoleon, MD 5/1/20156:39 PM

## 2013-08-14 LAB — RETICULOCYTES (CHCC)
ABS RETIC: 35.2 10*3/uL (ref 19.0–186.0)
RBC.: 5.03 MIL/uL (ref 3.87–5.11)
Retic Ct Pct: 0.7 % (ref 0.4–2.3)

## 2013-08-14 LAB — COPPER, SERUM: COPPER: 80 ug/dL (ref 70–175)

## 2013-08-14 LAB — VITAMIN B12: VITAMIN B 12: 196 pg/mL — AB (ref 211–911)

## 2013-09-21 ENCOUNTER — Encounter: Payer: Self-pay | Admitting: Hematology & Oncology

## 2013-09-21 ENCOUNTER — Other Ambulatory Visit (HOSPITAL_BASED_OUTPATIENT_CLINIC_OR_DEPARTMENT_OTHER): Payer: BC Managed Care – PPO | Admitting: Lab

## 2013-09-21 ENCOUNTER — Ambulatory Visit (HOSPITAL_BASED_OUTPATIENT_CLINIC_OR_DEPARTMENT_OTHER): Payer: BC Managed Care – PPO | Admitting: Hematology & Oncology

## 2013-09-21 ENCOUNTER — Ambulatory Visit (HOSPITAL_BASED_OUTPATIENT_CLINIC_OR_DEPARTMENT_OTHER): Payer: BC Managed Care – PPO

## 2013-09-21 ENCOUNTER — Telehealth: Payer: Self-pay | Admitting: Hematology & Oncology

## 2013-09-21 ENCOUNTER — Ambulatory Visit: Payer: BC Managed Care – PPO

## 2013-09-21 VITALS — BP 118/65 | HR 79 | Temp 98.2°F | Resp 14 | Ht 65.0 in | Wt 154.0 lb

## 2013-09-21 DIAGNOSIS — E538 Deficiency of other specified B group vitamins: Secondary | ICD-10-CM

## 2013-09-21 DIAGNOSIS — K319 Disease of stomach and duodenum, unspecified: Secondary | ICD-10-CM

## 2013-09-21 DIAGNOSIS — K909 Intestinal malabsorption, unspecified: Secondary | ICD-10-CM

## 2013-09-21 DIAGNOSIS — D510 Vitamin B12 deficiency anemia due to intrinsic factor deficiency: Secondary | ICD-10-CM

## 2013-09-21 DIAGNOSIS — E0500 Thyrotoxicosis with diffuse goiter without thyrotoxic crisis or storm: Secondary | ICD-10-CM

## 2013-09-21 DIAGNOSIS — D509 Iron deficiency anemia, unspecified: Secondary | ICD-10-CM

## 2013-09-21 LAB — CBC WITH DIFFERENTIAL (CANCER CENTER ONLY)
BASO#: 0 10*3/uL (ref 0.0–0.2)
BASO%: 0.2 % (ref 0.0–2.0)
EOS ABS: 0.2 10*3/uL (ref 0.0–0.5)
EOS%: 2.8 % (ref 0.0–7.0)
HCT: 42.3 % (ref 34.8–46.6)
HGB: 14.8 g/dL (ref 11.6–15.9)
LYMPH#: 1.7 10*3/uL (ref 0.9–3.3)
LYMPH%: 28.5 % (ref 14.0–48.0)
MCH: 31.6 pg (ref 26.0–34.0)
MCHC: 35 g/dL (ref 32.0–36.0)
MCV: 90 fL (ref 81–101)
MONO#: 0.4 10*3/uL (ref 0.1–0.9)
MONO%: 6.2 % (ref 0.0–13.0)
NEUT%: 62.3 % (ref 39.6–80.0)
NEUTROS ABS: 3.8 10*3/uL (ref 1.5–6.5)
PLATELETS: 230 10*3/uL (ref 145–400)
RBC: 4.68 10*6/uL (ref 3.70–5.32)
RDW: 12.6 % (ref 11.1–15.7)
WBC: 6.1 10*3/uL (ref 3.9–10.0)

## 2013-09-21 LAB — RETICULOCYTES (CHCC)
ABS Retic: 72.3 10*3/uL (ref 19.0–186.0)
RBC.: 4.82 MIL/uL (ref 3.87–5.11)
Retic Ct Pct: 1.5 % (ref 0.4–2.3)

## 2013-09-21 LAB — TSH: TSH: 4.534 u[IU]/mL — AB (ref 0.350–4.500)

## 2013-09-21 LAB — T4, FREE: Free T4: 0.81 ng/dL (ref 0.80–1.80)

## 2013-09-21 LAB — VITAMIN B12: VITAMIN B 12: 186 pg/mL — AB (ref 211–911)

## 2013-09-21 MED ORDER — CYANOCOBALAMIN 1000 MCG/ML IJ SOLN
1000.0000 ug | Freq: Once | INTRAMUSCULAR | Status: AC
  Administered 2013-09-21: 1000 ug via INTRAMUSCULAR

## 2013-09-21 MED ORDER — CYANOCOBALAMIN 1000 MCG/ML IJ SOLN
INTRAMUSCULAR | Status: AC
  Filled 2013-09-21: qty 1

## 2013-09-21 NOTE — Progress Notes (Signed)
Hematology and Oncology Follow Up Visit  Katherine Bradford [id number] [birth year] 53 y.o. 09/21/2013   Principle Diagnosis:   Pernicious anemia-anti-intrinsic factor antibodies  Iron deficiency anemia-malabsorption  Chronic gastric atrophy    Current Therapy:    Vitamin B12 1 mg IM every 3 months  IV iron as indicated     Interim History:  [name] is back for followup. She has done well with her cervical spine surgery. She had severe stenosis and C5-6 causing neuropathy. She was operated on back in March. Patient still has the numbness and feeling in her forearms and hands. I suppose this might be a long-term problem that she will have. I just don't think this is anything related to vitamin B 12 deficiency.  Of note, she has a history of Graves disease. She is not seeing an endocrinologist. We will have to refer her. I'm not sure who her family doctor is.  She is a well with iron. She is a well with B12. When we last saw her, her B12 level was 196. Her ferritin was 9 with iron saturation of 43%. She is back to work. She works as a Copywriter, advertising. She is able to work. Other she works part-time still.  Medications: Current outpatient prescriptions:cholecalciferol (VITAMIN D) 1000 UNITS tablet, Take 2,000 Units by mouth daily., Disp: , Rfl: ;  cyanocobalamin (,VITAMIN B-12,) 1000 MCG/ML injection, Inject 1,000 mcg into the muscle every 30 (thirty) days., Disp: , Rfl: ;  Efinaconazole (JUBLIA EX), Apply topically as needed., Disp: , Rfl: ;  folic acid (FOLVITE) 1 MG tablet, Take 2 mg by mouth daily., Disp: , Rfl:  vitamin E 100 UNIT capsule, Take 200 Units by mouth daily., Disp: , Rfl: ;  Ascorbic Acid (VITAMIN C) 500 MG CAPS, Take by mouth every morning., Disp: , Rfl:   Allergies:  Allergies  Allergen Reactions  . Vancomycin     Red neck syndrome from IV Vanco.  . Other     narcotics  . Prednisone Other (See Comments)    Patient prefers not to take due to the edginess it gives  her  . Epinephrine Palpitations  . Keflex [Cephalexin] Rash    Past Medical History, Surgical history, Social history, and Family History were reviewed and updated.  Review of Systems: As above  Physical Exam:  height is 5\' 5"  (1.651 m) and weight is 154 lb (69.854 kg). Her oral temperature is 98.2 F (36.8 C). Her blood pressure is 118/65 and her pulse is 79. Her respiration is 14.   Well-developed and well-nourished white female. Her head and neck exam shows no ocular or oral lesions. She has no palpable cervical or supra-clavicular lymph nodes. Her cervical laminectomy scar is well-healed in the left lower neck. Lungs are clear. Cardiac exam regular in rhythm with no murmurs rubs or bruits. Abdomen is soft. Good bowel sounds. Is no fluid wave. There is no palpable liver or spleen tip. Extremities shows no clubbing cyanosis or edema. His good strength in her hands and arms. His direct most of her joints. Has good pulses. Skin exam no rashes. Neurological exam shows some decreased sensation in her hands.  Lab Results  Component Value Date   WBC 6.1 09/21/2013   HGB 14.8 09/21/2013   HCT 42.3 09/21/2013   MCV 90 09/21/2013   PLT 230 09/21/2013     Chemistry      Component Value Date/Time   NA 141 09/21/2012 0920   K 4.3 09/21/2012 0920  CL 104 09/21/2012 0920   CO2 25 09/21/2012 0920   BUN 12 09/21/2012 0920   CREATININE 0.78 09/21/2012 0920      Component Value Date/Time   CALCIUM 9.2 09/21/2012 0920   ALKPHOS 79 09/21/2012 0920   AST 16 09/21/2012 0920   ALT 15 09/21/2012 0920   BILITOT 0.5 09/21/2012 0920         Impression and Plan: Katherine Bradford is a 53 year old white female with iron deficiency. She has pernicious anemia secondary to intrinsic factor antibodies.  We will give her B12 today.  We will see what her studies show. I would not think that trying to be a problem right now.. Again, she just may not have full neurological return with her on hands. She just may have his  chronic neuropathy. She does not take Lyrica or Neurontin because of side effects that she is worried about.  I'll plan to get a back in another couple months.  Again, we will make a referral to endocrinology.   Volanda Napoleon, MD 6/11/20156:30 PM

## 2013-09-21 NOTE — Patient Instructions (Signed)
Cyanocobalamin, Vitamin B12 injection °What is this medicine? °CYANOCOBALAMIN (sye an oh koe BAL a min) is a man made form of vitamin B12. Vitamin B12 is used in the growth of healthy blood cells, nerve cells, and proteins in the body. It also helps with the metabolism of fats and carbohydrates. This medicine is used to treat people who can not absorb vitamin B12. °This medicine may be used for other purposes; ask your health care provider or pharmacist if you have questions. °COMMON BRAND NAME(S): Cyomin, LA-12 , Nutri-Twelve , Primabalt °What should I tell my health care provider before I take this medicine? °They need to know if you have any of these conditions: °-kidney disease °-Leber's disease °-megaloblastic anemia °-an unusual or allergic reaction to cyanocobalamin, cobalt, other medicines, foods, dyes, or preservatives °-pregnant or trying to get pregnant °-breast-feeding °How should I use this medicine? °This medicine is injected into a muscle or deeply under the skin. It is usually given by a health care professional in a clinic or doctor's office. However, your doctor may teach you how to inject yourself. Follow all instructions. °Talk to your pediatrician regarding the use of this medicine in children. Special care may be needed. °Overdosage: If you think you have taken too much of this medicine contact a poison control center or emergency room at once. °NOTE: This medicine is only for you. Do not share this medicine with others. °What if I miss a dose? °If you are given your dose at a clinic or doctor's office, call to reschedule your appointment. If you give your own injections and you miss a dose, take it as soon as you can. If it is almost time for your next dose, take only that dose. Do not take double or extra doses. °What may interact with this medicine? °-colchicine °-heavy alcohol intake °This list may not describe all possible interactions. Give your health care provider a list of all the  medicines, herbs, non-prescription drugs, or dietary supplements you use. Also tell them if you smoke, drink alcohol, or use illegal drugs. Some items may interact with your medicine. °What should I watch for while using this medicine? °Visit your doctor or health care professional regularly. You may need blood work done while you are taking this medicine. °You may need to follow a special diet. Talk to your doctor. Limit your alcohol intake and avoid smoking to get the best benefit. °What side effects may I notice from receiving this medicine? °Side effects that you should report to your doctor or health care professional as soon as possible: °-allergic reactions like skin rash, itching or hives, swelling of the face, lips, or tongue °-blue tint to skin °-chest tightness, pain °-difficulty breathing, wheezing °-dizziness °-red, swollen painful area on the leg °Side effects that usually do not require medical attention (report to your doctor or health care professional if they continue or are bothersome): °-diarrhea °-headache °This list may not describe all possible side effects. Call your doctor for medical advice about side effects. You may report side effects to FDA at 1-800-FDA-1088. °Where should I keep my medicine? °Keep out of the reach of children. °Store at room temperature between 15 and 30 degrees C (59 and 85 degrees F). Protect from light. Throw away any unused medicine after the expiration date. °NOTE: This sheet is a summary. It may not cover all possible information. If you have questions about this medicine, talk to your doctor, pharmacist, or health care provider. °© 2014, [employer]vier/[employer] Standard. (2007-07-11   22:10:20) ° °

## 2013-09-21 NOTE — Telephone Encounter (Signed)
Pt aware of 7-16 9 am appointment with Dr. Steffanie Dunn at [address].I scheduled with Darien Ramus MD's nurse also aware. Dr. Marin Olp aware

## 2013-09-22 LAB — IRON AND TIBC CHCC
%SAT: 23 % (ref 21–57)
IRON: 68 ug/dL (ref 41–142)
TIBC: 294 ug/dL (ref 236–444)
UIBC: 226 ug/dL (ref 120–384)

## 2013-09-22 LAB — FERRITIN CHCC: Ferritin: 49 ng/ml (ref 9–269)

## 2013-11-23 ENCOUNTER — Encounter: Payer: Self-pay | Admitting: Hematology & Oncology

## 2013-11-23 ENCOUNTER — Ambulatory Visit (HOSPITAL_BASED_OUTPATIENT_CLINIC_OR_DEPARTMENT_OTHER): Payer: BC Managed Care – PPO | Admitting: Hematology & Oncology

## 2013-11-23 ENCOUNTER — Ambulatory Visit (HOSPITAL_BASED_OUTPATIENT_CLINIC_OR_DEPARTMENT_OTHER): Payer: BC Managed Care – PPO

## 2013-11-23 ENCOUNTER — Other Ambulatory Visit (HOSPITAL_BASED_OUTPATIENT_CLINIC_OR_DEPARTMENT_OTHER): Payer: BC Managed Care – PPO | Admitting: Lab

## 2013-11-23 VITALS — BP 121/75 | HR 71 | Temp 97.2°F | Resp 14 | Ht 66.0 in | Wt 159.0 lb

## 2013-11-23 DIAGNOSIS — D510 Vitamin B12 deficiency anemia due to intrinsic factor deficiency: Secondary | ICD-10-CM

## 2013-11-23 DIAGNOSIS — D509 Iron deficiency anemia, unspecified: Secondary | ICD-10-CM

## 2013-11-23 DIAGNOSIS — E0500 Thyrotoxicosis with diffuse goiter without thyrotoxic crisis or storm: Secondary | ICD-10-CM

## 2013-11-23 DIAGNOSIS — E039 Hypothyroidism, unspecified: Secondary | ICD-10-CM

## 2013-11-23 DIAGNOSIS — E538 Deficiency of other specified B group vitamins: Secondary | ICD-10-CM

## 2013-11-23 LAB — VITAMIN B12: VITAMIN B 12: 181 pg/mL — AB (ref 211–911)

## 2013-11-23 LAB — CBC WITH DIFFERENTIAL (CANCER CENTER ONLY)
BASO#: 0 10*3/uL (ref 0.0–0.2)
BASO%: 0.7 % (ref 0.0–2.0)
EOS%: 5.9 % (ref 0.0–7.0)
Eosinophils Absolute: 0.3 10*3/uL (ref 0.0–0.5)
HCT: 43.3 % (ref 34.8–46.6)
HEMOGLOBIN: 14.9 g/dL (ref 11.6–15.9)
LYMPH#: 1.3 10*3/uL (ref 0.9–3.3)
LYMPH%: 28 % (ref 14.0–48.0)
MCH: 30.8 pg (ref 26.0–34.0)
MCHC: 34.4 g/dL (ref 32.0–36.0)
MCV: 90 fL (ref 81–101)
MONO#: 0.3 10*3/uL (ref 0.1–0.9)
MONO%: 6.2 % (ref 0.0–13.0)
NEUT#: 2.7 10*3/uL (ref 1.5–6.5)
NEUT%: 59.2 % (ref 39.6–80.0)
Platelets: 260 10*3/uL (ref 145–400)
RBC: 4.83 10*6/uL (ref 3.70–5.32)
RDW: 11.9 % (ref 11.1–15.7)
WBC: 4.5 10*3/uL (ref 3.9–10.0)

## 2013-11-23 LAB — CHCC SATELLITE - SMEAR

## 2013-11-23 MED ORDER — CYANOCOBALAMIN 1000 MCG/ML IJ SOLN
INTRAMUSCULAR | Status: AC
  Filled 2013-11-23: qty 1

## 2013-11-23 MED ORDER — CYANOCOBALAMIN 1000 MCG/ML IJ SOLN
1000.0000 ug | Freq: Once | INTRAMUSCULAR | Status: AC
  Administered 2013-11-23: 1000 ug via INTRAMUSCULAR

## 2013-11-23 MED ORDER — THYROID 30 MG PO TABS: 30.0000 mg | ORAL_TABLET | Freq: Every day | ORAL | Status: DC

## 2013-11-23 NOTE — Patient Instructions (Signed)

## 2013-11-24 LAB — IRON AND TIBC CHCC
%SAT: 21 % (ref 21–57)
Iron: 65 ug/dL (ref 41–142)
TIBC: 310 ug/dL (ref 236–444)
UIBC: 245 ug/dL (ref 120–384)

## 2013-11-24 LAB — FERRITIN CHCC: Ferritin: 31 ng/ml (ref 9–269)

## 2013-11-24 NOTE — Progress Notes (Signed)
Hematology and Oncology Follow Up Visit  Katherine Bradford [id number] June 30, 1960 53 y.o. 11/24/2013   Principle Diagnosis:   Pernicious anemia-anti-intrinsic factor antibodies  Iron deficiency anemia-malabsorption  Chronic gastric atrophy  Current Therapy:    Vitamin B12 1 mg IM every 3 months  IV iron as indicated     Interim History:  [name] is back for followup. She still has numbness on the palms of her hands. 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 Hopefully, this will improve.  She is now on Synthroid. She has a rash on her arms and legs. She started Synthroid 3 weeks ago. The rash developed a few days ago. It may be from the Synthroid and from some impurities in the product. I will change her to Armour thyroid.  She is working. She is enjoy her work. She's having no problems with fever. There's been no cough. She's had no change in bowel or bladder habits.  Medications: Current outpatient prescriptions:Ascorbic Acid (VITAMIN C) 500 MG CAPS, Take by mouth every morning., Disp: , Rfl: ;  cholecalciferol (VITAMIN D) 1000 UNITS tablet, Take 2,000 Units by mouth daily., Disp: , Rfl: ;  cyanocobalamin (,VITAMIN B-12,) 1000 MCG/ML injection, Inject 1,000 mcg into the muscle every 30 (thirty) days., Disp: , Rfl:  diphenhydrAMINE (BENADRYL) 25 MG tablet, Take 25 mg by mouth every 6 (six) hours as needed (has rash on arms)., Disp: , Rfl: ;  Efinaconazole (JUBLIA EX), Apply topically as needed., Disp: , Rfl: ;  folic acid (FOLVITE) 1 MG tablet, Take 2 mg by mouth daily., Disp: , Rfl: ;  levothyroxine (SYNTHROID, LEVOTHROID) 50 MCG tablet, Take 50 mcg by mouth., Disp: , Rfl: ;  vitamin E 100 UNIT capsule, Take 200 Units by mouth daily., Disp: , Rfl:  thyroid (ARMOUR THYROID) 30 MG tablet, Take 1 tablet (30 mg total) by mouth daily before breakfast., Disp: 30 tablet, Rfl: 6  Allergies:  Allergies  Allergen Reactions  . Vancomycin    Red neck syndrome from IV Vanco.  . Other     narcotics  . Prednisone Other (See Comments)    Patient prefers not to take due to the edginess it gives her  . Epinephrine Palpitations  . Keflex [Cephalexin] Rash    Past Medical History, Surgical history, Social history, and Family History were reviewed and updated.  Review of Systems: As above  Physical Exam:  height is 5\' 6"  (1.676 m) and weight is 159 lb (72.122 kg). Her oral temperature is 97.2 F (36.2 C). Her blood pressure is 121/75 and her pulse is 71. Her respiration is 14.   Well-developed and well-nourished white female. Lungs are clear. Cardiac exam regular rhythm. Abdomen is soft. She's good bowel sounds. There is no fluid wave. There is no palpable liver or spleen tip. I exam no tenderness over the spine ribs or hips. Extremities shows no clubbing cyanosis or edema. She's good range of motion of her joints. She is good strength in her extremities. Skin exam shows a maculopapular type rash on her arms and upper legs. Neurological exam shows no focal neurological deficits.  Lab Results  Component Value Date   WBC 4.5 11/23/2013   HGB 14.9 11/23/2013   HCT 43.3 11/23/2013   MCV 90 11/23/2013   PLT 260 11/23/2013     Chemistry      Component Value Date/Time   NA 141 09/21/2012 0920   K 4.3 09/21/2012 0920  CL 104 09/21/2012 0920   CO2 25 09/21/2012 0920   BUN 12 09/21/2012 0920   CREATININE 0.78 09/21/2012 0920      Component Value Date/Time   CALCIUM 9.2 09/21/2012 0920   ALKPHOS 79 09/21/2012 0920   AST 16 09/21/2012 0920   ALT 15 09/21/2012 0920   BILITOT 0.5 09/21/2012 0920         Impression and Plan: Katherine Bradford is 53 year old female. She hasn't pernicious anemia. She has positive intrinsic factor antibodies.  We will probably need to give her vitamin B12 monthly now. Her B12 level is 181. We need to try to increase his a little more.  We will see where iron studies show. We may need to give her iron. Her  hemoglobin is holding steady. Her MCV is holding steady.  We will see if changing her thyroid preparation will help with this rash.  I will see her back myself in about 2-3 months.   Volanda Napoleon, MD 8/14/20156:06 AM

## 2013-11-27 ENCOUNTER — Telehealth: Payer: Self-pay

## 2013-11-27 ENCOUNTER — Other Ambulatory Visit: Payer: Self-pay

## 2013-11-27 DIAGNOSIS — D509 Iron deficiency anemia, unspecified: Secondary | ICD-10-CM

## 2013-11-27 NOTE — Telephone Encounter (Addendum)
Message copied by Johny Drilling on Mon Nov 27, 2013  1:01 PM ------      Message from: Burney Gauze R      Created: Sun Nov 26, 2013  8:30 PM       Call - iron is low again.  Please set up Feraheme 1020mg  x 1 dose this or next week.  Thanks!!  pete ------ Attempted to contact pt at work (her preferred contact) & she is off today. Message left on generic home VM to contact our office for lab results and instructions. dph

## 2013-12-01 ENCOUNTER — Ambulatory Visit (HOSPITAL_BASED_OUTPATIENT_CLINIC_OR_DEPARTMENT_OTHER): Payer: BC Managed Care – PPO

## 2013-12-01 VITALS — BP 94/57 | HR 72 | Temp 96.9°F | Resp 16

## 2013-12-01 DIAGNOSIS — D509 Iron deficiency anemia, unspecified: Secondary | ICD-10-CM

## 2013-12-01 MED ORDER — SODIUM CHLORIDE 0.9 % IV SOLN
INTRAVENOUS | Status: DC
  Administered 2013-12-01: 15:00:00 via INTRAVENOUS

## 2013-12-01 MED ORDER — CYANOCOBALAMIN 1000 MCG/ML IJ SOLN
INTRAMUSCULAR | Status: AC
  Filled 2013-12-01: qty 1

## 2013-12-01 MED ORDER — SODIUM CHLORIDE 0.9 % IV SOLN
1020.0000 mg | Freq: Once | INTRAVENOUS | Status: AC
  Administered 2013-12-01: 1020 mg via INTRAVENOUS
  Filled 2013-12-01: qty 34

## 2013-12-01 NOTE — Patient Instructions (Signed)

## 2013-12-19 ENCOUNTER — Telehealth: Payer: Self-pay | Admitting: Hematology & Oncology

## 2013-12-19 ENCOUNTER — Ambulatory Visit: Payer: BC Managed Care – PPO

## 2013-12-19 ENCOUNTER — Other Ambulatory Visit: Payer: BC Managed Care – PPO | Admitting: Lab

## 2013-12-19 NOTE — Telephone Encounter (Signed)
Pt left message to cx 9-8 I left her message to call and reschedule

## 2013-12-22 ENCOUNTER — Ambulatory Visit: Payer: BC Managed Care – PPO

## 2013-12-22 ENCOUNTER — Other Ambulatory Visit: Payer: BC Managed Care – PPO | Admitting: Lab

## 2013-12-29 ENCOUNTER — Ambulatory Visit (HOSPITAL_BASED_OUTPATIENT_CLINIC_OR_DEPARTMENT_OTHER): Payer: BC Managed Care – PPO

## 2013-12-29 VITALS — BP 124/40 | HR 69 | Temp 97.7°F

## 2013-12-29 DIAGNOSIS — D509 Iron deficiency anemia, unspecified: Secondary | ICD-10-CM

## 2013-12-29 DIAGNOSIS — D510 Vitamin B12 deficiency anemia due to intrinsic factor deficiency: Secondary | ICD-10-CM

## 2013-12-29 MED ORDER — CYANOCOBALAMIN 1000 MCG/ML IJ SOLN
1000.0000 ug | Freq: Once | INTRAMUSCULAR | Status: AC
  Administered 2013-12-29: 1000 ug via INTRAMUSCULAR

## 2013-12-29 MED ORDER — CYANOCOBALAMIN 1000 MCG/ML IJ SOLN
INTRAMUSCULAR | Status: AC
  Filled 2013-12-29: qty 1

## 2013-12-29 NOTE — Patient Instructions (Signed)

## 2014-01-05 ENCOUNTER — Other Ambulatory Visit: Payer: Self-pay | Admitting: Nurse Practitioner

## 2014-01-05 MED ORDER — FOLIC ACID 1 MG PO TABS: 2.0000 mg | ORAL_TABLET | Freq: Every day | ORAL | Status: DC

## 2014-01-08 ENCOUNTER — Other Ambulatory Visit: Payer: Self-pay | Admitting: Nurse Practitioner

## 2014-01-17 ENCOUNTER — Telehealth: Payer: Self-pay | Admitting: Hematology & Oncology

## 2014-01-17 NOTE — Telephone Encounter (Signed)
Pt moved 10-9 to 10-16 said 10-9 would be early

## 2014-01-19 ENCOUNTER — Ambulatory Visit: Payer: BC Managed Care – PPO

## 2014-01-26 ENCOUNTER — Ambulatory Visit (HOSPITAL_BASED_OUTPATIENT_CLINIC_OR_DEPARTMENT_OTHER): Payer: BC Managed Care – PPO

## 2014-01-26 ENCOUNTER — Other Ambulatory Visit: Payer: Self-pay | Admitting: *Deleted

## 2014-01-26 VITALS — BP 124/56 | HR 68 | Temp 98.7°F | Resp 16

## 2014-01-26 DIAGNOSIS — D510 Vitamin B12 deficiency anemia due to intrinsic factor deficiency: Secondary | ICD-10-CM

## 2014-01-26 DIAGNOSIS — Z23 Encounter for immunization: Secondary | ICD-10-CM

## 2014-01-26 DIAGNOSIS — E538 Deficiency of other specified B group vitamins: Secondary | ICD-10-CM

## 2014-01-26 DIAGNOSIS — D509 Iron deficiency anemia, unspecified: Secondary | ICD-10-CM

## 2014-01-26 MED ORDER — CYANOCOBALAMIN 1000 MCG/ML IJ SOLN
1000.0000 ug | Freq: Once | INTRAMUSCULAR | Status: AC
Start: 2014-01-26 — End: 2014-01-26
  Administered 2014-01-26: 1000 ug via INTRAMUSCULAR

## 2014-01-26 MED ORDER — INFLUENZA VAC SPLIT QUAD 0.5 ML IM SUSY
0.5000 mL | PREFILLED_SYRINGE | INTRAMUSCULAR | Status: AC
  Administered 2014-01-26: 0.5 mL via INTRAMUSCULAR
  Filled 2014-01-26: qty 0.5

## 2014-01-26 MED ORDER — CYANOCOBALAMIN 1000 MCG/ML IJ SOLN
INTRAMUSCULAR | Status: AC
  Filled 2014-01-26: qty 1

## 2014-01-26 MED ORDER — FOLIC ACID 1 MG PO TABS: 2.0000 mg | ORAL_TABLET | Freq: Every day | ORAL | Status: DC

## 2014-01-26 NOTE — Patient Instructions (Signed)
Cyanocobalamin, Vitamin B12 injection What is this medicine? CYANOCOBALAMIN (sye an oh koe BAL a min) is a man made form of vitamin B12. Vitamin B12 is used in the growth of healthy blood cells, nerve cells, and proteins in the body. It also helps with the metabolism of fats and carbohydrates. This medicine is used to treat people who can not absorb vitamin B12. This medicine may be used for other purposes; ask your health care provider or pharmacist if you have questions. COMMON BRAND NAME(S): Cyomin, LA-12, Nutri-Twelve, Primabalt What should I tell my health care provider before I take this medicine? They need to know if you have any of these conditions: -kidney disease -Leber's disease -megaloblastic anemia -an unusual or allergic reaction to cyanocobalamin, cobalt, other medicines, foods, dyes, or preservatives -pregnant or trying to get pregnant -breast-feeding How should I use this medicine? This medicine is injected into a muscle or deeply under the skin. It is usually given by a health care professional in a clinic or doctor's office. However, your doctor may teach you how to inject yourself. Follow all instructions. Talk to your pediatrician regarding the use of this medicine in children. Special care may be needed. Overdosage: If you think you have taken too much of this medicine contact a poison control center or emergency room at once. NOTE: This medicine is only for you. Do not share this medicine with others. What if I miss a dose? If you are given your dose at a clinic or doctor's office, call to reschedule your appointment. If you give your own injections and you miss a dose, take it as soon as you can. If it is almost time for your next dose, take only that dose. Do not take double or extra doses. What may interact with this medicine? -colchicine -heavy alcohol intake This list may not describe all possible interactions. Give your health care provider a list of all the  medicines, herbs, non-prescription drugs, or dietary supplements you use. Also tell them if you smoke, drink alcohol, or use illegal drugs. Some items may interact with your medicine. What should I watch for while using this medicine? Visit your doctor or health care professional regularly. You may need blood work done while you are taking this medicine. You may need to follow a special diet. Talk to your doctor. Limit your alcohol intake and avoid smoking to get the best benefit. What side effects may I notice from receiving this medicine? Side effects that you should report to your doctor or health care professional as soon as possible: -allergic reactions like skin rash, itching or hives, swelling of the face, lips, or tongue -blue tint to skin -chest tightness, pain -difficulty breathing, wheezing -dizziness -red, swollen painful area on the leg Side effects that usually do not require medical attention (report to your doctor or health care professional if they continue or are bothersome): -diarrhea -headache This list may not describe all possible side effects. Call your doctor for medical advice about side effects. You may report side effects to FDA at 1-800-FDA-1088. Where should I keep my medicine? Keep out of the reach of children. Store at room temperature between 15 and 30 degrees C (59 and 85 degrees F). Protect from light. Throw away any unused medicine after the expiration date. NOTE: This sheet is a summary. It may not cover all possible information. If you have questions about this medicine, talk to your doctor, pharmacist, or health care provider.  2015, Elsevier/Gold Standard. (2007-07-11 22:10:20) Influenza  Virus Vaccine injection (Fluarix) What is this medicine? INFLUENZA VIRUS VACCINE (in floo EN zuh VAHY ruhs vak SEEN) helps to reduce the risk of getting influenza also known as the flu. This medicine may be used for other purposes; ask your health care provider or  pharmacist if you have questions. COMMON BRAND NAME(S): Fluarix, Fluzone What should I tell my health care provider before I take this medicine? They need to know if you have any of these conditions: -bleeding disorder like hemophilia -fever or infection -Guillain-Barre syndrome or other neurological problems -immune system problems -infection with the human immunodeficiency virus (HIV) or AIDS -low blood platelet counts -multiple sclerosis -an unusual or allergic reaction to influenza virus vaccine, eggs, chicken proteins, latex, gentamicin, other medicines, foods, dyes or preservatives -pregnant or trying to get pregnant -breast-feeding How should I use this medicine? This vaccine is for injection into a muscle. It is given by a health care professional. A copy of Vaccine Information Statements will be given before each vaccination. Read this sheet carefully each time. The sheet may change frequently. Talk to your pediatrician regarding the use of this medicine in children. Special care may be needed. Overdosage: If you think you have taken too much of this medicine contact a poison control center or emergency room at once. NOTE: This medicine is only for you. Do not share this medicine with others. What if I miss a dose? This does not apply. What may interact with this medicine? -chemotherapy or radiation therapy -medicines that lower your immune system like etanercept, anakinra, infliximab, and adalimumab -medicines that treat or prevent blood clots like warfarin -phenytoin -steroid medicines like prednisone or cortisone -theophylline -vaccines This list may not describe all possible interactions. Give your health care provider a list of all the medicines, herbs, non-prescription drugs, or dietary supplements you use. Also tell them if you smoke, drink alcohol, or use illegal drugs. Some items may interact with your medicine. What should I watch for while using this  medicine? Report any side effects that do not go away within 3 days to your doctor or health care professional. Call your health care provider if any unusual symptoms occur within 6 weeks of receiving this vaccine. You may still catch the flu, but the illness is not usually as bad. You cannot get the flu from the vaccine. The vaccine will not protect against colds or other illnesses that may cause fever. The vaccine is needed every year. What side effects may I notice from receiving this medicine? Side effects that you should report to your doctor or health care professional as soon as possible: -allergic reactions like skin rash, itching or hives, swelling of the face, lips, or tongue Side effects that usually do not require medical attention (report to your doctor or health care professional if they continue or are bothersome): -fever -headache -muscle aches and pains -pain, tenderness, redness, or swelling at site where injected -weak or tired This list may not describe all possible side effects. Call your doctor for medical advice about side effects. You may report side effects to FDA at 1-800-FDA-1088. Where should I keep my medicine? This vaccine is only given in a clinic, pharmacy, doctor's office, or other health care setting and will not be stored at home. NOTE: This sheet is a summary. It may not cover all possible information. If you have questions about this medicine, talk to your doctor, pharmacist, or health care provider.  2015, Elsevier/Gold Standard. (2007-10-26 09:30:40)

## 2014-02-16 ENCOUNTER — Other Ambulatory Visit: Payer: BC Managed Care – PPO | Admitting: Lab

## 2014-02-16 ENCOUNTER — Ambulatory Visit: Payer: BC Managed Care – PPO | Admitting: Hematology & Oncology

## 2014-02-16 ENCOUNTER — Ambulatory Visit: Payer: BC Managed Care – PPO

## 2014-02-23 ENCOUNTER — Ambulatory Visit (HOSPITAL_BASED_OUTPATIENT_CLINIC_OR_DEPARTMENT_OTHER): Payer: BC Managed Care – PPO | Admitting: Family

## 2014-02-23 ENCOUNTER — Other Ambulatory Visit (HOSPITAL_BASED_OUTPATIENT_CLINIC_OR_DEPARTMENT_OTHER): Payer: BC Managed Care – PPO | Admitting: Lab

## 2014-02-23 ENCOUNTER — Encounter: Payer: Self-pay | Admitting: Family

## 2014-02-23 ENCOUNTER — Ambulatory Visit (HOSPITAL_BASED_OUTPATIENT_CLINIC_OR_DEPARTMENT_OTHER): Payer: BC Managed Care – PPO

## 2014-02-23 DIAGNOSIS — D510 Vitamin B12 deficiency anemia due to intrinsic factor deficiency: Secondary | ICD-10-CM

## 2014-02-23 DIAGNOSIS — E538 Deficiency of other specified B group vitamins: Secondary | ICD-10-CM

## 2014-02-23 DIAGNOSIS — E039 Hypothyroidism, unspecified: Secondary | ICD-10-CM

## 2014-02-23 DIAGNOSIS — D509 Iron deficiency anemia, unspecified: Secondary | ICD-10-CM

## 2014-02-23 LAB — RETICULOCYTES (CHCC)
ABS Retic: 55.6 10*3/uL (ref 19.0–186.0)
RBC.: 5.05 MIL/uL (ref 3.87–5.11)
RETIC CT PCT: 1.1 % (ref 0.4–2.3)

## 2014-02-23 LAB — FERRITIN CHCC: Ferritin: 302 ng/ml — ABNORMAL HIGH (ref 9–269)

## 2014-02-23 LAB — CBC WITH DIFFERENTIAL (CANCER CENTER ONLY)
BASO#: 0 10*3/uL (ref 0.0–0.2)
BASO%: 0.4 % (ref 0.0–2.0)
EOS ABS: 0.3 10*3/uL (ref 0.0–0.5)
EOS%: 6.5 % (ref 0.0–7.0)
HEMATOCRIT: 45.6 % (ref 34.8–46.6)
HEMOGLOBIN: 15.8 g/dL (ref 11.6–15.9)
LYMPH#: 1.4 10*3/uL (ref 0.9–3.3)
LYMPH%: 27.5 % (ref 14.0–48.0)
MCH: 31.5 pg (ref 26.0–34.0)
MCHC: 34.6 g/dL (ref 32.0–36.0)
MCV: 91 fL (ref 81–101)
MONO#: 0.4 10*3/uL (ref 0.1–0.9)
MONO%: 7.1 % (ref 0.0–13.0)
NEUT%: 58.5 % (ref 39.6–80.0)
NEUTROS ABS: 3 10*3/uL (ref 1.5–6.5)
Platelets: 240 10*3/uL (ref 145–400)
RBC: 5.01 10*6/uL (ref 3.70–5.32)
RDW: 13 % (ref 11.1–15.7)
WBC: 5.1 10*3/uL (ref 3.9–10.0)

## 2014-02-23 LAB — IRON AND TIBC CHCC
%SAT: 39 % (ref 21–57)
Iron: 99 ug/dL (ref 41–142)
TIBC: 258 ug/dL (ref 236–444)
UIBC: 158 ug/dL (ref 120–384)

## 2014-02-23 LAB — VITAMIN B12: Vitamin B-12: 250 pg/mL (ref 211–911)

## 2014-02-23 MED ORDER — CYANOCOBALAMIN 1000 MCG/ML IJ SOLN
INTRAMUSCULAR | Status: AC
  Filled 2014-02-23: qty 1

## 2014-02-23 MED ORDER — CYANOCOBALAMIN 1000 MCG/ML IJ SOLN
1000.0000 ug | Freq: Once | INTRAMUSCULAR | Status: AC
  Administered 2014-02-23: 1000 ug via INTRAMUSCULAR

## 2014-02-23 NOTE — Patient Instructions (Signed)

## 2014-02-23 NOTE — Progress Notes (Signed)
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 She is still having numbness in her hands. Her neurologist told her this is from a pinched nerve in her neck.  Her PCP took her off Synthroid because it was causing her a rash and she was border line hypothyroid and they didn't feel she needed the synthroid at this time.  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 Danielle Dess. She states that the borders were clear. We are working on obtaining the pathology report for this. She denies fever, chills, n/v, cough, rash, headache, dizziness, SOB, chest pain, palpitations, abdominal pain, constipation, diarrhea, blood in urine or stool.  No swelling, tenderness, numbness or tingling in her extremities. No lymphadenopathy.  Her appetite is good and she is staying hydrated.   CURRENT TREATMENT: Vitamin B12 1 mg IM every 3 months IV iron as indicated  REVIEW OF SYSTEMS: All other 10 point review of systems is negative.   PAST MEDICAL HISTORY: Past Medical History  Diagnosis Date  . Numbness and tingling in hands   . Graves disease   . Weakness   . Anemia   . B12 deficiency   . Iron deficiency anemia, unspecified 12/27/2012    PAST SURGICAL HISTORY: Past Surgical History  Procedure Laterality Date  . None      FAMILY HISTORY Family History  Problem Relation Age of Onset  . High blood pressure Mother   . Osteoarthritis Mother   . Thyroid disease Mother   . Heart failure Father   . Diabetes type II Brother   . Heart disease Father   . Colon cancer Neg Hx     GYNECOLOGIC  HISTORY:  No LMP recorded.   SOCIAL HISTORY: History   Social History  . Marital Status: Married    Spouse Name: Herbie Baltimore    Number of Children: 2  . 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     Social History Main Topics  . Smoking status: Never Smoker   . Smokeless tobacco: Never Used     Comment: Never used tobacco  . Alcohol Use: No  . Drug Use: No  . Sexual Activity: Not on file   Other Topics Concern  . Not on file   Social History Narrative   Patient lives at home at husband Herbie [place]). Patient works full time. Patient has college education.   Right handed.   Caffeine- one cup daily.    ADVANCED DIRECTIVES:  <no information>  HEALTH MAINTENANCE: History  Substance Use Topics  . Smoking status: Never Smoker   . Smokeless tobacco: Never Used     Comment: Never used tobacco  . Alcohol Use: No   Colonoscopy: PAP: Bone density: Lipid panel:  Allergies  Allergen Reactions  . Vancomycin     Red neck syndrome from IV Vanco.  . Other     narcotics  . Prednisone Other (See Comments)    Patient prefers not to take due to the edginess it gives her  . Epinephrine Palpitations  . Keflex [Cephalexin] Rash    Current Outpatient  Prescriptions  Medication Sig Dispense Refill  . Ascorbic Acid (VITAMIN C) 500 MG CAPS Take by mouth every morning.    . cholecalciferol (VITAMIN D) 1000 UNITS tablet Take 2,000 Units by mouth daily.    . cyanocobalamin (,VITAMIN B-12,) 1000 MCG/ML injection Inject 1,000 mcg into the muscle every 30 (thirty) days.    . Efinaconazole (JUBLIA EX) Apply topically as needed.    . folic acid (FOLVITE) 1 MG tablet Take 2 tablets (2 mg total) by mouth daily. 60 tablet 6  . vitamin E 100 UNIT capsule Take 200 Units by mouth daily.     No current facility-administered medications for this visit.    OBJECTIVE: Filed Vitals:   02/23/14 0908  BP: 125/75  Pulse: 69  Temp: 97.5 F  (36.4 C)  Resp: 14    Filed Weights   02/23/14 0908  Weight: 162 lb (73.483 kg)   ECOG FS:0 - Asymptomatic Ocular: Sclerae unicteric, pupils equal, round and reactive to light Ear-nose-throat: Oropharynx clear, dentition fair Lymphatic: No cervical or supraclavicular adenopathy Lungs no rales or rhonchi, good excursion bilaterally Heart regular rate and rhythm, no murmur appreciated Abd soft, nontender, positive bowel sounds MSK no focal spinal tenderness, no joint edema Neuro: non-focal, well-oriented, appropriate affect Breasts: Deferred  LAB RESULTS: CMP     Component Value Date/Time   NA 141 09/21/2012 0920   K 4.3 09/21/2012 0920   CL 104 09/21/2012 0920   CO2 25 09/21/2012 0920   GLUCOSE 95 09/21/2012 0920   BUN 12 09/21/2012 0920   CREATININE 0.78 09/21/2012 0920   CALCIUM 9.2 09/21/2012 0920   PROT 7.0 10/05/2012 0821   AST 16 09/21/2012 0920   ALT 15 09/21/2012 0920   ALKPHOS 79 09/21/2012 0920   BILITOT 0.5 09/21/2012 0920   GFRNONAA 88 09/21/2012 0920   GFRAA 102 09/21/2012 0920   INo results found for: SPEP, UPEP Lab Results  Component Value Date   WBC 5.1 02/23/2014   NEUTROABS 3.0 02/23/2014   HGB 15.8 02/23/2014   HCT 45.6 02/23/2014   MCV 91 02/23/2014   PLT 240 02/23/2014   No results found for: LABCA2 No components found for: ATFTD322 No results for input(s): INR in the last 168 hours. Urinalysis No results found for: COLORURINE, APPEARANCEUR, LABSPEC, PHURINE, GLUCOSEU, HGBUR, BILIRUBINUR, KETONESUR, PROTEINUR, UROBILINOGEN, NITRITE, LEUKOCYTESUR STUDIES: No results found.  ASSESSMENT/PLAN: Ms. Rudy is 53 year old female with pernicious anemia. She has positive intrinsic factor antibodies.  She gets her vitamin B12 monthly. Her B 12 level in August was 181.  Her CBC today was normal. We will see what her iron studies show.   We will see her back in 3 months for labs and follow-up.   She knows to call here with any questions or  concerns and to go to the ED in the event of an emergency. We can certainly see her sooner is need be.   Eliezer Bottom, NP 02/23/2014 12:00 PM

## 2014-02-26 ENCOUNTER — Telehealth: Payer: Self-pay | Admitting: *Deleted

## 2014-02-26 NOTE — Telephone Encounter (Addendum)
Spoke with patient  ----- Message from Volanda Napoleon, MD sent at 02/23/2014  5:47 PM EST ----- Call - iron is good!  pete

## 2014-03-22 ENCOUNTER — Telehealth: Payer: Self-pay | Admitting: Hematology & Oncology

## 2014-03-22 NOTE — Telephone Encounter (Signed)
Pt moved 12-11 to 12-17

## 2014-03-23 ENCOUNTER — Ambulatory Visit: Payer: BC Managed Care – PPO

## 2014-03-28 ENCOUNTER — Other Ambulatory Visit: Payer: Self-pay

## 2014-03-28 DIAGNOSIS — Z1231 Encounter for screening mammogram for malignant neoplasm of breast: Secondary | ICD-10-CM

## 2014-03-29 ENCOUNTER — Ambulatory Visit: Payer: BC Managed Care – PPO

## 2014-04-20 ENCOUNTER — Encounter (INDEPENDENT_AMBULATORY_CARE_PROVIDER_SITE_OTHER): Payer: Self-pay

## 2014-04-20 ENCOUNTER — Ambulatory Visit (HOSPITAL_BASED_OUTPATIENT_CLINIC_OR_DEPARTMENT_OTHER): Payer: BLUE CROSS/BLUE SHIELD

## 2014-04-20 ENCOUNTER — Ambulatory Visit
Admission: RE | Admit: 2014-04-20 | Discharge: 2014-04-20 | Disposition: A | Discharge status: Home / Self Care | Payer: BLUE CROSS/BLUE SHIELD | Source: Ambulatory Visit

## 2014-04-20 DIAGNOSIS — D509 Iron deficiency anemia, unspecified: Secondary | ICD-10-CM

## 2014-04-20 DIAGNOSIS — D510 Vitamin B12 deficiency anemia due to intrinsic factor deficiency: Secondary | ICD-10-CM

## 2014-04-20 DIAGNOSIS — Z1231 Encounter for screening mammogram for malignant neoplasm of breast: Secondary | ICD-10-CM

## 2014-04-20 MED ORDER — CYANOCOBALAMIN 1000 MCG/ML IJ SOLN
1000.0000 ug | Freq: Once | INTRAMUSCULAR | Status: AC
  Administered 2014-04-20: 1000 ug via INTRAMUSCULAR

## 2014-04-20 MED ORDER — CYANOCOBALAMIN 1000 MCG/ML IJ SOLN
INTRAMUSCULAR | Status: AC
  Filled 2014-04-20: qty 1

## 2014-04-20 NOTE — Patient Instructions (Signed)

## 2014-05-17 ENCOUNTER — Other Ambulatory Visit: Payer: Self-pay | Admitting: Family

## 2014-05-25 ENCOUNTER — Ambulatory Visit (HOSPITAL_BASED_OUTPATIENT_CLINIC_OR_DEPARTMENT_OTHER): Payer: BLUE CROSS/BLUE SHIELD | Admitting: Family

## 2014-05-25 ENCOUNTER — Other Ambulatory Visit (HOSPITAL_BASED_OUTPATIENT_CLINIC_OR_DEPARTMENT_OTHER): Payer: BLUE CROSS/BLUE SHIELD | Admitting: Lab

## 2014-05-25 ENCOUNTER — Ambulatory Visit (HOSPITAL_BASED_OUTPATIENT_CLINIC_OR_DEPARTMENT_OTHER): Payer: BLUE CROSS/BLUE SHIELD

## 2014-05-25 ENCOUNTER — Encounter: Payer: Self-pay | Admitting: Family

## 2014-05-25 VITALS — BP 126/58 | HR 79 | Temp 97.5°F | Resp 20 | Ht 66.0 in | Wt 166.0 lb

## 2014-05-25 DIAGNOSIS — D510 Vitamin B12 deficiency anemia due to intrinsic factor deficiency: Secondary | ICD-10-CM

## 2014-05-25 DIAGNOSIS — E538 Deficiency of other specified B group vitamins: Secondary | ICD-10-CM

## 2014-05-25 DIAGNOSIS — D509 Iron deficiency anemia, unspecified: Secondary | ICD-10-CM

## 2014-05-25 LAB — CBC WITH DIFFERENTIAL (CANCER CENTER ONLY)
BASO#: 0 10*3/uL (ref 0.0–0.2)
BASO%: 0.4 % (ref 0.0–2.0)
EOS%: 7.9 % — ABNORMAL HIGH (ref 0.0–7.0)
Eosinophils Absolute: 0.4 10*3/uL (ref 0.0–0.5)
HEMATOCRIT: 46.1 % (ref 34.8–46.6)
HGB: 15.7 g/dL (ref 11.6–15.9)
LYMPH#: 1.5 10*3/uL (ref 0.9–3.3)
LYMPH%: 28.3 % (ref 14.0–48.0)
MCH: 30.9 pg (ref 26.0–34.0)
MCHC: 34.1 g/dL (ref 32.0–36.0)
MCV: 91 fL (ref 81–101)
MONO#: 0.4 10*3/uL (ref 0.1–0.9)
MONO%: 7.3 % (ref 0.0–13.0)
NEUT%: 56.1 % (ref 39.6–80.0)
NEUTROS ABS: 2.9 10*3/uL (ref 1.5–6.5)
PLATELETS: 259 10*3/uL (ref 145–400)
RBC: 5.08 10*6/uL (ref 3.70–5.32)
RDW: 12.3 % (ref 11.1–15.7)
WBC: 5.2 10*3/uL (ref 3.9–10.0)

## 2014-05-25 LAB — RETICULOCYTES (CHCC)
ABS Retic: 51.3 10*3/uL (ref 19.0–186.0)
RBC.: 5.13 MIL/uL — ABNORMAL HIGH (ref 3.87–5.11)
Retic Ct Pct: 1 % (ref 0.4–2.3)

## 2014-05-25 LAB — IRON AND TIBC CHCC
%SAT: 34 % (ref 21–57)
IRON: 90 ug/dL (ref 41–142)
TIBC: 261 ug/dL (ref 236–444)
UIBC: 171 ug/dL (ref 120–384)

## 2014-05-25 LAB — VITAMIN B12: VITAMIN B 12: 274 pg/mL (ref 211–911)

## 2014-05-25 LAB — FERRITIN CHCC: Ferritin: 212 ng/ml (ref 9–269)

## 2014-05-25 MED ORDER — CYANOCOBALAMIN 1000 MCG/ML IJ SOLN
INTRAMUSCULAR | Status: AC
  Filled 2014-05-25: qty 1

## 2014-05-25 MED ORDER — CYANOCOBALAMIN 1000 MCG/ML IJ SOLN
1000.0000 ug | Freq: Once | INTRAMUSCULAR | Status: AC
  Administered 2014-05-25: 1000 ug via INTRAMUSCULAR

## 2014-05-25 NOTE — Patient Instructions (Signed)

## 2014-05-25 NOTE — Progress Notes (Signed)
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 She is doing well.  She is back on Synthroid and has an appointment for labs and dose adjustment in 2 weeks.  She is followed closely by dermatology and has an appointment with them next month. She had a melanoma removed from her right leg. The borders were clear.  She denies fever, chills, n/v, cough, rash, headache, dizziness, SOB, chest pain, palpitations, abdominal pain, constipation, diarrhea, blood in urine or stool.  She has numbness and tingling in her hands caused by a pinched nerve in her neck. She is followed by neurology. No swelling or tenderness in her extremities. No lymphadenopathy.  Her appetite is good and she is staying hydrated. Her weight is stable at 162 lbs.  She had her mammogram in January and it was negative.   CURRENT TREATMENT: Vitamin B12 1 mg IM monthly IV iron as indicated  REVIEW OF SYSTEMS: All other 10 point review of systems is negative.   PAST MEDICAL HISTORY: Past Medical History  Diagnosis Date  . Numbness and tingling in hands   . Graves disease   . Weakness   . Anemia   . B12 deficiency   . Iron deficiency anemia, unspecified 12/27/2012    PAST SURGICAL HISTORY: Past Surgical History  Procedure Laterality Date  . None      FAMILY HISTORY Family History  Problem Relation Age of Onset  . High blood pressure Mother   . Osteoarthritis Mother   . Thyroid disease Mother   . Heart failure Father   . Diabetes type II Brother   . Heart disease Father   . Colon cancer Neg Hx      GYNECOLOGIC HISTORY:  No LMP recorded.   SOCIAL HISTORY: History   Social History  . Marital Status: Married    Spouse Name: Katherine Bradford  . Number of Children: 2  . 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     Social History Main Topics  . Smoking status: Never Smoker   . Smokeless tobacco: Never Used     Comment: Never used tobacco  . Alcohol Use: No  . Drug Use: No  . Sexual Activity: Not on file   Other Topics Concern  . Not on file   Social History Narrative   Patient lives at home at husband Katherine Bradford). Patient works full time. Patient has college education.   Right handed.   Caffeine- one cup daily.    ADVANCED DIRECTIVES:  <no information>  HEALTH MAINTENANCE: History  Substance Use Topics  . Smoking status: Never Smoker   . Smokeless tobacco: Never Used     Comment: Never used tobacco  . Alcohol Use: No   Colonoscopy: PAP: Bone density: Lipid panel:  Allergies  Allergen Reactions  . Vancomycin     Red neck syndrome from IV Vanco.  . Other     narcotics  . Prednisone Other (See Comments)    Patient prefers not to take due to the edginess it gives her  . Epinephrine Palpitations  . Keflex [Cephalexin] Rash  Current Outpatient Prescriptions  Medication Sig Dispense Refill  . Ascorbic Acid (VITAMIN C) 500 MG CAPS Take by mouth every morning.    . cholecalciferol (VITAMIN D) 1000 UNITS tablet Take 2,000 Units by mouth daily.    . cyanocobalamin (,VITAMIN B-12,) 1000 MCG/ML injection Inject 1,000 mcg into the muscle every 30 (thirty) days.    . Efinaconazole (JUBLIA EX) Apply topically as needed.    . folic acid (FOLVITE) 1 MG tablet Take 2 tablets (2 mg total) by mouth daily. 60 tablet 6  . vitamin E 100 UNIT capsule Take 200 Units by mouth daily.     No current facility-administered medications for this visit.    OBJECTIVE: There were no vitals filed for this visit.  There were  no vitals filed for this visit. ECOG FS:0 - Asymptomatic Ocular: Sclerae unicteric, pupils equal, round and reactive to light Ear-nose-throat: Oropharynx clear, dentition fair Lymphatic: No cervical or supraclavicular adenopathy Lungs no rales or rhonchi, good excursion bilaterally Heart regular rate and rhythm, no murmur appreciated Abd soft, nontender, positive bowel sounds MSK no focal spinal tenderness, no joint edema Neuro: non-focal, well-oriented, appropriate affect Breasts: Deferred  LAB RESULTS: CMP     Component Value Date/Time   NA 141 09/21/2012 0920   K 4.3 09/21/2012 0920   CL 104 09/21/2012 0920   CO2 25 09/21/2012 0920   GLUCOSE 95 09/21/2012 0920   BUN 12 09/21/2012 0920   CREATININE 0.78 09/21/2012 0920   CALCIUM 9.2 09/21/2012 0920   PROT 7.0 10/05/2012 0821   AST 16 09/21/2012 0920   ALT 15 09/21/2012 0920   ALKPHOS 79 09/21/2012 0920   BILITOT 0.5 09/21/2012 0920   GFRNONAA 88 09/21/2012 0920   GFRAA 102 09/21/2012 0920   INo results found for: SPEP, UPEP Lab Results  Component Value Date   WBC 5.1 02/23/2014   NEUTROABS 3.0 02/23/2014   HGB 15.8 02/23/2014   HCT 45.6 02/23/2014   MCV 91 02/23/2014   PLT 240 02/23/2014   No results found for: LABCA2 No components found for: GGYIR485 No results for input(s): INR in the last 168 hours. Urinalysis No results found for: COLORURINE, APPEARANCEUR, LABSPEC, PHURINE, GLUCOSEU, HGBUR, BILIRUBINUR, KETONESUR, PROTEINUR, UROBILINOGEN, NITRITE, LEUKOCYTESUR STUDIES: No results found.  ASSESSMENT/PLAN: Katherine Bradford is 54 year old female with pernicious anemia. She has positive intrinsic factor antibodies.  She gets her vitamin B12 monthly. Her B 12 level in November was 250.  Her CBC today was normal. We will see what her iron studies show.   We will see her back in 3 months for labs and follow-up.   She knows to call here with any questions or concerns and to go to the ED in the event of an emergency.  We can certainly see her sooner is need be.   Eliezer Bottom, NP 05/25/2014 9:01 AM

## 2014-06-22 ENCOUNTER — Ambulatory Visit (HOSPITAL_BASED_OUTPATIENT_CLINIC_OR_DEPARTMENT_OTHER): Payer: BLUE CROSS/BLUE SHIELD

## 2014-06-22 DIAGNOSIS — D509 Iron deficiency anemia, unspecified: Secondary | ICD-10-CM

## 2014-06-22 DIAGNOSIS — D510 Vitamin B12 deficiency anemia due to intrinsic factor deficiency: Secondary | ICD-10-CM

## 2014-06-22 MED ORDER — CYANOCOBALAMIN 1000 MCG/ML IJ SOLN
1000.0000 ug | Freq: Once | INTRAMUSCULAR | Status: AC
  Administered 2014-06-22: 1000 ug via INTRAMUSCULAR

## 2014-06-22 MED ORDER — CYANOCOBALAMIN 1000 MCG/ML IJ SOLN
INTRAMUSCULAR | Status: AC
  Filled 2014-06-22: qty 1

## 2014-06-22 NOTE — Patient Instructions (Signed)

## 2014-06-25 ENCOUNTER — Ambulatory Visit: Payer: BC Managed Care – PPO | Admitting: Neurology

## 2014-06-25 ENCOUNTER — Telehealth: Payer: Self-pay | Admitting: *Deleted

## 2014-06-25 NOTE — Telephone Encounter (Signed)
Canceled same day as appt.

## 2014-07-18 ENCOUNTER — Other Ambulatory Visit: Payer: Self-pay | Admitting: Obstetrics and Gynecology

## 2014-07-19 ENCOUNTER — Ambulatory Visit (HOSPITAL_BASED_OUTPATIENT_CLINIC_OR_DEPARTMENT_OTHER): Payer: BLUE CROSS/BLUE SHIELD

## 2014-07-19 DIAGNOSIS — D510 Vitamin B12 deficiency anemia due to intrinsic factor deficiency: Secondary | ICD-10-CM

## 2014-07-19 DIAGNOSIS — D509 Iron deficiency anemia, unspecified: Secondary | ICD-10-CM

## 2014-07-19 MED ORDER — CYANOCOBALAMIN 1000 MCG/ML IJ SOLN
1000.0000 ug | Freq: Once | INTRAMUSCULAR | Status: AC
  Administered 2014-07-19: 1000 ug via INTRAMUSCULAR

## 2014-07-19 MED ORDER — CYANOCOBALAMIN 1000 MCG/ML IJ SOLN
INTRAMUSCULAR | Status: AC
  Filled 2014-07-19: qty 1

## 2014-07-19 NOTE — Patient Instructions (Signed)

## 2014-07-20 ENCOUNTER — Ambulatory Visit: Payer: BLUE CROSS/BLUE SHIELD

## 2014-07-20 LAB — CYTOLOGY - PAP

## 2014-08-17 ENCOUNTER — Encounter: Payer: Self-pay | Admitting: Hematology & Oncology

## 2014-08-17 ENCOUNTER — Other Ambulatory Visit (HOSPITAL_BASED_OUTPATIENT_CLINIC_OR_DEPARTMENT_OTHER): Payer: BLUE CROSS/BLUE SHIELD

## 2014-08-17 ENCOUNTER — Ambulatory Visit (HOSPITAL_BASED_OUTPATIENT_CLINIC_OR_DEPARTMENT_OTHER): Payer: BLUE CROSS/BLUE SHIELD

## 2014-08-17 ENCOUNTER — Ambulatory Visit (HOSPITAL_BASED_OUTPATIENT_CLINIC_OR_DEPARTMENT_OTHER): Payer: BLUE CROSS/BLUE SHIELD | Admitting: Hematology & Oncology

## 2014-08-17 VITALS — BP 122/65 | HR 76 | Temp 98.0°F | Resp 14 | Ht 66.0 in | Wt 166.0 lb

## 2014-08-17 DIAGNOSIS — D509 Iron deficiency anemia, unspecified: Secondary | ICD-10-CM

## 2014-08-17 DIAGNOSIS — G609 Hereditary and idiopathic neuropathy, unspecified: Secondary | ICD-10-CM | POA: Diagnosis not present

## 2014-08-17 DIAGNOSIS — D510 Vitamin B12 deficiency anemia due to intrinsic factor deficiency: Secondary | ICD-10-CM | POA: Diagnosis not present

## 2014-08-17 DIAGNOSIS — D508 Other iron deficiency anemias: Secondary | ICD-10-CM

## 2014-08-17 DIAGNOSIS — E538 Deficiency of other specified B group vitamins: Secondary | ICD-10-CM

## 2014-08-17 LAB — CBC WITH DIFFERENTIAL (CANCER CENTER ONLY)
BASO#: 0 10*3/uL (ref 0.0–0.2)
BASO%: 0.7 % (ref 0.0–2.0)
EOS%: 4.8 % (ref 0.0–7.0)
Eosinophils Absolute: 0.3 10*3/uL (ref 0.0–0.5)
HCT: 45.4 % (ref 34.8–46.6)
HGB: 15.4 g/dL (ref 11.6–15.9)
LYMPH#: 1.5 10*3/uL (ref 0.9–3.3)
LYMPH%: 27.4 % (ref 14.0–48.0)
MCH: 30.6 pg (ref 26.0–34.0)
MCHC: 33.9 g/dL (ref 32.0–36.0)
MCV: 90 fL (ref 81–101)
MONO#: 0.3 10*3/uL (ref 0.1–0.9)
MONO%: 6.3 % (ref 0.0–13.0)
NEUT%: 60.8 % (ref 39.6–80.0)
NEUTROS ABS: 3.3 10*3/uL (ref 1.5–6.5)
Platelets: 260 10*3/uL (ref 145–400)
RBC: 5.03 10*6/uL (ref 3.70–5.32)
RDW: 12.6 % (ref 11.1–15.7)
WBC: 5.4 10*3/uL (ref 3.9–10.0)

## 2014-08-17 LAB — IRON AND TIBC CHCC
%SAT: 28 % (ref 21–57)
Iron: 70 ug/dL (ref 41–142)
TIBC: 253 ug/dL (ref 236–444)
UIBC: 183 ug/dL (ref 120–384)

## 2014-08-17 LAB — RETICULOCYTES (CHCC)
ABS Retic: 60 10*3/uL (ref 19.0–186.0)
RBC.: 5 MIL/uL (ref 3.87–5.11)
Retic Ct Pct: 1.2 % (ref 0.4–2.3)

## 2014-08-17 LAB — VITAMIN B12: Vitamin B-12: 206 pg/mL — ABNORMAL LOW (ref 211–911)

## 2014-08-17 LAB — FERRITIN CHCC: Ferritin: 146 ng/ml (ref 9–269)

## 2014-08-17 MED ORDER — CYANOCOBALAMIN 1000 MCG/ML IJ SOLN
INTRAMUSCULAR | Status: AC
  Filled 2014-08-17: qty 1

## 2014-08-17 MED ORDER — CYANOCOBALAMIN 1000 MCG/ML IJ SOLN
1000.0000 ug | Freq: Once | INTRAMUSCULAR | Status: AC
  Administered 2014-08-17: 1000 ug via INTRAMUSCULAR

## 2014-08-17 NOTE — Progress Notes (Signed)
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 She is doing okay. Blood saw her back in February. She is still working. She still is staying active.  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 Per graph she did have melanoma surgery on the right upper inner thigh. This was back in October. She had a stage Ia melanoma. The Breslow depth was 0.35 mm. She then underwent a wide local excision. I told her that the risk of this coming back probably is going to be less than 5%.  She's had no problems with bleeding. His been no change in bowel or bladder habits. She's had no issues with nausea or vomiting.  Her last vitamin B-12 level was 274 in February. Her last iron studies in February showed a ferritin of T12 with a iron saturation of 34%.  Overall, her performance status is ECOG 0. lbs.    CURRENT TREATMENT: Vitamin B12 1 mg IM monthly IV iron as indicated  REVIEW OF SYSTEMS: All other 10 point review of systems is negative.   PAST MEDICAL HISTORY: Past Medical History  Diagnosis Date  . Numbness and tingling in hands   . Graves disease   . Weakness   . Anemia   . B12 deficiency   . Iron deficiency anemia, unspecified 12/27/2012    PAST SURGICAL HISTORY: Past Surgical History  Procedure Laterality Date  . None      FAMILY HISTORY Family History  Problem Relation Age of Onset  . High blood  pressure Mother   . Osteoarthritis Mother   . Thyroid disease Mother   . Heart failure Father   . Diabetes type II Brother   . Heart disease Father   . Colon cancer Neg Hx     GYNECOLOGIC HISTORY:  No LMP recorded.   SOCIAL HISTORY: History   Social History  . Marital Status: Married    Spouse Name: Herbie [place]  . Number of Children: 2  . 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     Social History Main Topics  . Smoking status: Never Smoker   . Smokeless tobacco: Never Used     Comment: Never used tobacco  . Alcohol Use: No  . Drug Use: No  . Sexual Activity: Not on file   Other Topics Concern  . Not on file   Social History Narrative   Patient lives at home at husband Herbie [place]). Patient works full time. Patient has college education.   Right handed.   Caffeine- one cup daily.    ADVANCED DIRECTIVES:  <no information>  HEALTH MAINTENANCE: History  Substance Use Topics  . Smoking status: Never Smoker   . Smokeless tobacco: Never Used     Comment: Never used tobacco  . Alcohol Use: No   Colonoscopy: PAP: Bone density: Lipid panel:  Allergies  Allergen Reactions  . Vancomycin     Red neck syndrome  from IV Vanco.  . Other     narcotics  . Prednisone Other (See Comments)    Patient prefers not to take due to the edginess it gives her  . Epinephrine Palpitations  . Keflex [Cephalexin] Rash    Current Outpatient Prescriptions  Medication Sig Dispense Refill  . Ascorbic Acid (VITAMIN C) 500 MG CAPS Take by mouth every morning.    . cholecalciferol (VITAMIN D) 1000 UNITS tablet Take 2,000 Units by mouth daily.    . cyanocobalamin (,VITAMIN B-12,) 1000 MCG/ML injection Inject 1,000 mcg into the muscle every 30 (thirty) days.    . folic acid (FOLVITE) 1 MG tablet Take 2 tablets (2 mg total) by mouth daily. 60 tablet 6  . levothyroxine (SYNTHROID, LEVOTHROID) 50 MCG tablet Take 50 mcg by mouth daily.     Marland Kitchen UNABLE TO FIND Apply topically as needed. Kerytin    . vitamin E 100 UNIT capsule Take 100 Units by mouth daily.      No current facility-administered medications for this visit.   Facility-Administered Medications Ordered in Other Visits  Medication Dose Route Frequency Provider Last Rate Last Dose  . cyanocobalamin ((VITAMIN B-12)) injection 1,000 mcg  1,000 mcg Intramuscular Once Volanda Napoleon, MD        OBJECTIVE: Filed Vitals:   08/17/14 0848  BP: 122/65  Pulse: 76  Temp: 98 F (36.7 C)  Resp: 14    Filed Weights   08/17/14 0848  Weight: 166 lb (75.297 kg)   ECOG FS:0 - Asymptomatic Ocular: Sclerae unicteric, pupils equal, round and reactive to light Ear-nose-throat: Oropharynx clear, dentition fair Lymphatic: No cervical or supraclavicular adenopathy Lungs no rales or rhonchi, good excursion bilaterally Heart regular rate and rhythm, no murmur appreciated Abd soft, nontender, positive bowel sounds MSK no focal spinal tenderness, no joint edema Neuro: non-focal, well-oriented, appropriate affect Breasts: Deferred  LAB RESULTS: CMP     Component Value Date/Time   NA 141 09/21/2012 0920   K 4.3 09/21/2012 0920   CL 104 09/21/2012 0920   CO2 25 09/21/2012 0920   GLUCOSE 95 09/21/2012 0920   BUN 12 09/21/2012 0920   CREATININE 0.78 09/21/2012 0920   CALCIUM 9.2 09/21/2012 0920   PROT 7.0 10/05/2012 0821   AST 16 09/21/2012 0920   ALT 15 09/21/2012 0920   ALKPHOS 79 09/21/2012 0920   BILITOT 0.5 09/21/2012 0920   GFRNONAA 88 09/21/2012 0920   GFRAA 102 09/21/2012 0920   INo results found for: SPEP, UPEP Lab Results  Component Value Date   WBC 5.4 08/17/2014   NEUTROABS 3.3 08/17/2014   HGB 15.4 08/17/2014   HCT 45.4 08/17/2014   MCV 90 08/17/2014   PLT 260 08/17/2014   No results found for: LABCA2 No components found for: SPQZR007 No results for input(s): INR in the last 168 hours. Urinalysis No results found for: COLORURINE, APPEARANCEUR,  LABSPEC, PHURINE, GLUCOSEU, HGBUR, BILIRUBINUR, KETONESUR, PROTEINUR, UROBILINOGEN, NITRITE, LEUKOCYTESUR STUDIES: No results found.  ASSESSMENT/PLAN: Katherine Bradford is 54 year old female with pernicious anemia. She has positive intrinsic factor antibodies.  She gets her vitamin B12 monthly.   I will plan to see her back myself in about 3 months. Volanda Napoleon, MD 08/17/2014 9:36 AM

## 2014-08-21 IMAGING — CT CT CERVICAL SPINE W/O CM
5 of 9 series · 11 of 33 positions shown, 12 images · non-contrast
Comparison: No priors.

CT HEAD

CLINICAL DATA: History of trauma from a fall with injury to the
head and neck.

CT HEAD WITHOUT CONTRAST
CT MAXILLOFACIAL WITHOUT CONTRAST
CT CERVICAL SPINE WITHOUT CONTRAST
TECHNIQUE: Multidetector CT imaging of the head, cervical spine,
and maxillofacial structures were performed using the standard
protocol without intravenous contrast. Multiplanar CT image
reconstructions of the cervical spine and maxillofacial structures
were also generated.

[Series 6: facial 2.0 h31s st · axial · 0.34mm/px · z∈[-248,-200]mm · 2 of 74 slices shown, 3 images]
[im 25/74  soft-tissue]
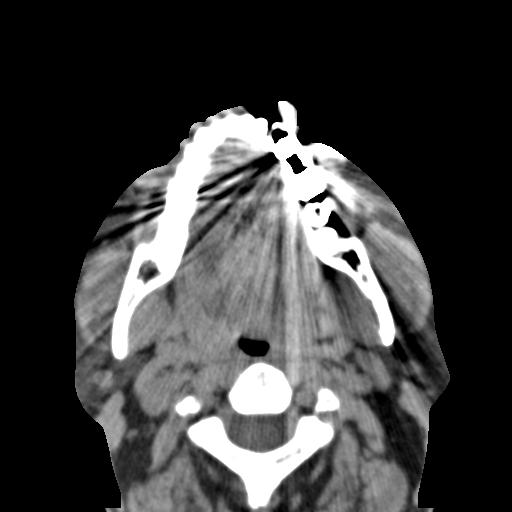
[im 25/74  bone]
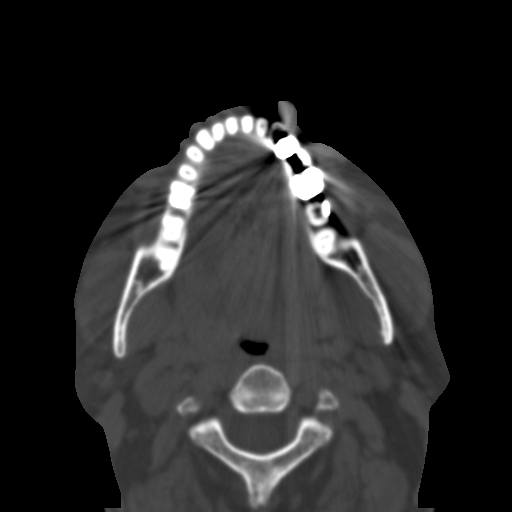
[im 49/74  bone]
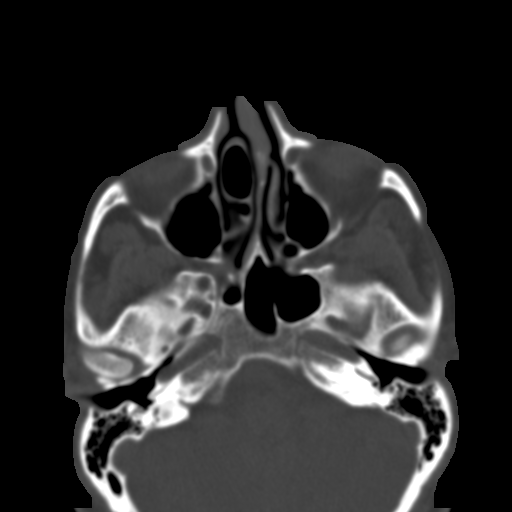

[Series 12: facial sagittals · sagittal · 0.28mm/px · 3 of 69 slices shown]
[im 18/69  bone]
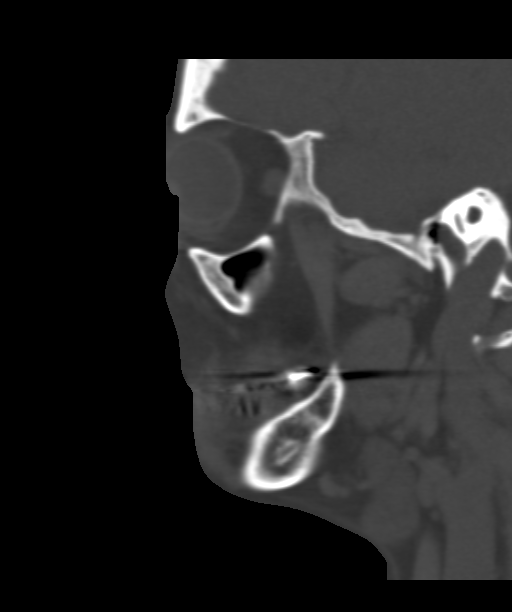
[im 35/69  bone]
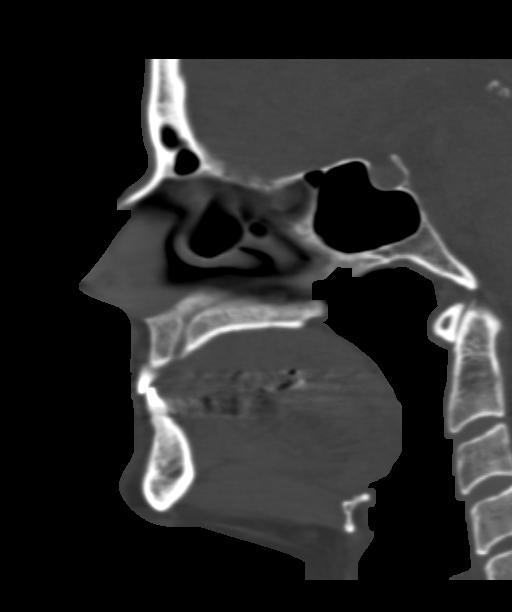
[im 52/69  bone]
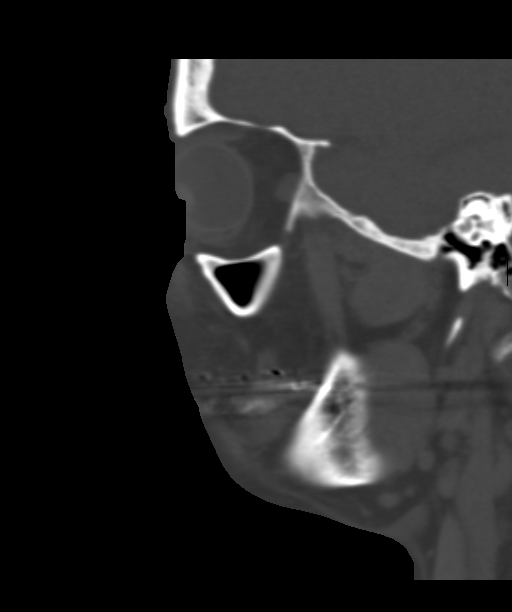

[Series 14: c_spine 2.0 b31s detail · axial · 0.23mm/px · z∈[-289,-237]mm · 2 of 79 slices shown]
[im 27/79  bone]
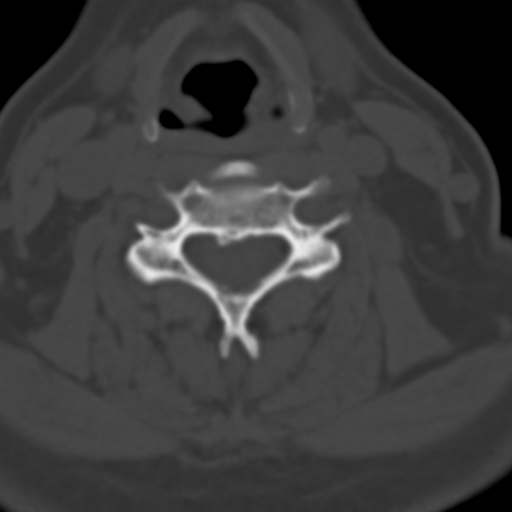
[im 53/79  bone]
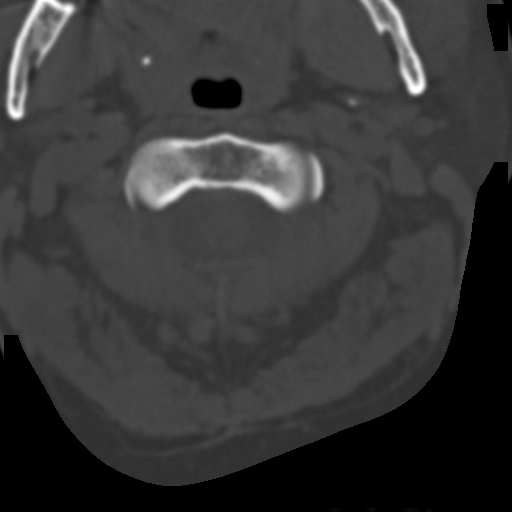

[Series 16: coronals · coronal · 0.30mm/px · 2 of 50 slices shown]
[im 5/50  bone]
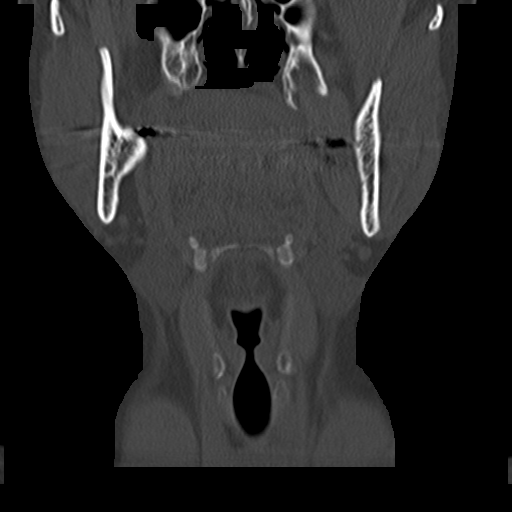
[im 27/50  bone]
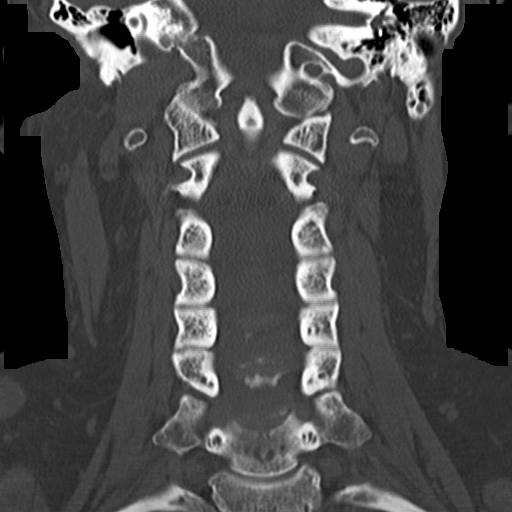

[Series 18: orthogonals · axial · 0.27mm/px · z∈[-311,-269]mm · 2 of 68 slices shown]
[im 23/68  bone]
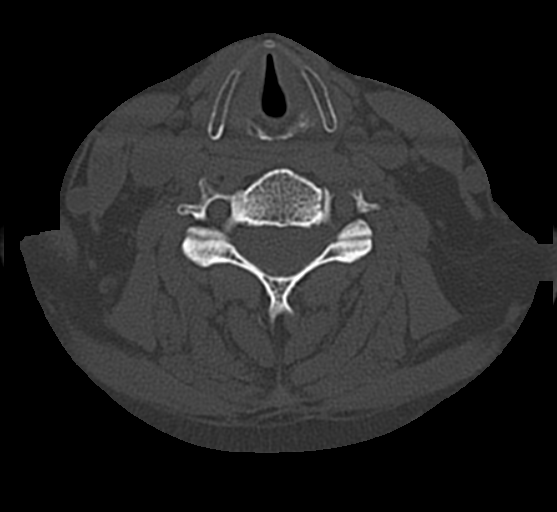
[im 45/68  bone]
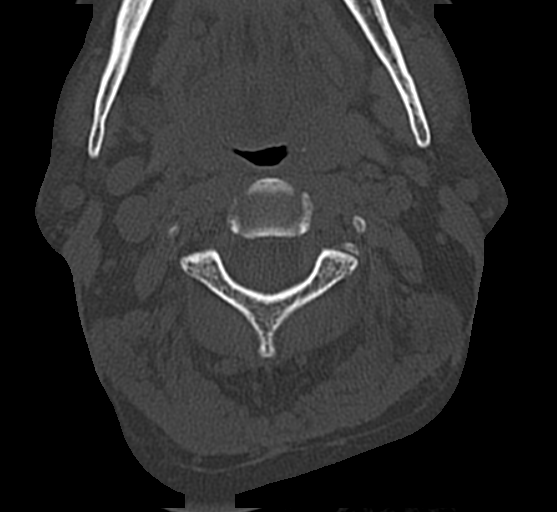

[11 of 33 positions shown; findings below may reference images not displayed]

FINDINGS: No acute displaced skull fractures are identified.  No
acute intracranial abnormality.  Specifically, no evidence of acute
post-traumatic intracranial hemorrhage, no definite regions of
acute/subacute cerebral ischemia, no focal mass, mass effect,
hydrocephalus or abnormal intra or extra-axial fluid collections.
The visualized paranasal sinuses and mastoids are well pneumatized
with the exception of a small amount of opacification in the left
ethmoid sinuses.
IMPRESSION: 1.  No acute displaced skull fractures or acute intracranial
abnormalities.
2.  The appearance of the brain is normal.

CT MAXILLOFACIAL
FINDINGS: There is a defect in the lamina papyracea on the left
with a small amount of extraconal orbital fat extending into the
defect in the left ethmoid sinuses. Along the inferior margin of
this there is a tiny collection of high attenuation material (image
52 of series 6 and image 24 of series 602) which may represent some
chronic inspissated secretions, or may represent a small amount of
hemorrhage.  Other than this finding, there are no new other overt
signs of acute trauma.  It is uncertain whether not this represents
a chronic injury or an acute medial orbital blowout fracture. No
entrapment of extraocular muscles.  No other acute displaced facial
bone fractures are identified.  Mandibular condyles are properly
located.  Pterygoid plates are intact.  Polypoid soft tissue
attenuation lesions are present within the maxillary sinuses
bilaterally and may represent mucosal retention cysts and/or
polyps.  Bilateral globes are intact.  There is a small amount of
soft tissue swelling in the subcutaneous fat anterior to the left
maxilla, presumably a soft tissue contusion.
IMPRESSION: 1.  Soft tissue contusion in the subcutaneous fat into the left
maxilla.   Small defect in the left lamina papyracea and herniation
of small amount of extraconal orbital fat into the defect in the
left ethmoid sinuses. There is one small focus of high attenuation
material adjacent to this which could represent a small amount of
blood, or may simply represent chronic inspissated secretions.
Clinical correlation for signs and symptoms of acute left-sided
orbital blowout fracture is recommended.  If there is no prior
history of trauma, this injury is likely acute.
2.  No other evidence of acute displaced facial bone fractures are
noted.
3.  Mucosal retention cyst versus polyps in the maxillary sinuses
bilaterally.

CT CERVICAL SPINE
FINDINGS: No acute displaced fractures of the cervical spine are
noted.  Alignment is anatomic.  Prevertebral soft tissues are
normal.  There is mild multilevel degenerative disc disease, most
severe at C5-C6.
IMPRESSION: 1.  No evidence of significant acute traumatic injury to the
cervical spine.

to PA Stenneth, who verbally acknowledged these results.

## 2014-09-14 ENCOUNTER — Other Ambulatory Visit: Payer: Self-pay | Admitting: *Deleted

## 2014-09-14 ENCOUNTER — Ambulatory Visit (HOSPITAL_BASED_OUTPATIENT_CLINIC_OR_DEPARTMENT_OTHER): Payer: BLUE CROSS/BLUE SHIELD

## 2014-09-14 ENCOUNTER — Other Ambulatory Visit: Payer: BLUE CROSS/BLUE SHIELD

## 2014-09-14 VITALS — BP 127/67 | HR 71 | Temp 98.0°F | Resp 16 | Ht 66.0 in | Wt 166.0 lb

## 2014-09-14 DIAGNOSIS — D510 Vitamin B12 deficiency anemia due to intrinsic factor deficiency: Secondary | ICD-10-CM | POA: Insufficient documentation

## 2014-09-14 DIAGNOSIS — M5090 Cervical disc disorder, unspecified, unspecified cervical region: Secondary | ICD-10-CM | POA: Insufficient documentation

## 2014-09-14 DIAGNOSIS — D509 Iron deficiency anemia, unspecified: Secondary | ICD-10-CM

## 2014-09-14 DIAGNOSIS — G64 Other disorders of peripheral nervous system: Secondary | ICD-10-CM | POA: Insufficient documentation

## 2014-09-14 MED ORDER — CYANOCOBALAMIN 1000 MCG/ML IJ SOLN
INTRAMUSCULAR | Status: AC
  Filled 2014-09-14: qty 1

## 2014-09-14 MED ORDER — CYANOCOBALAMIN 1000 MCG/ML IJ SOLN
1000.0000 ug | Freq: Once | INTRAMUSCULAR | Status: AC
  Administered 2014-09-14: 1000 ug via INTRAMUSCULAR

## 2014-09-14 MED ORDER — FOLIC ACID 1 MG PO TABS: 2.0000 mg | ORAL_TABLET | Freq: Every day | ORAL | Status: DC

## 2014-09-14 NOTE — Patient Instructions (Signed)

## 2014-10-12 ENCOUNTER — Ambulatory Visit (HOSPITAL_BASED_OUTPATIENT_CLINIC_OR_DEPARTMENT_OTHER): Payer: BLUE CROSS/BLUE SHIELD

## 2014-10-12 ENCOUNTER — Other Ambulatory Visit: Payer: BLUE CROSS/BLUE SHIELD

## 2014-10-12 VITALS — BP 127/67 | HR 68 | Temp 98.4°F | Resp 18 | Wt 167.0 lb

## 2014-10-12 DIAGNOSIS — D510 Vitamin B12 deficiency anemia due to intrinsic factor deficiency: Secondary | ICD-10-CM

## 2014-10-12 DIAGNOSIS — D509 Iron deficiency anemia, unspecified: Secondary | ICD-10-CM

## 2014-10-12 MED ORDER — CYANOCOBALAMIN 1000 MCG/ML IJ SOLN
1000.0000 ug | Freq: Once | INTRAMUSCULAR | Status: AC
  Administered 2014-10-12: 1000 ug via INTRAMUSCULAR

## 2014-10-12 MED ORDER — CYANOCOBALAMIN 1000 MCG/ML IJ SOLN
INTRAMUSCULAR | Status: AC
  Filled 2014-10-12: qty 1

## 2014-10-12 NOTE — Patient Instructions (Signed)

## 2014-11-09 ENCOUNTER — Ambulatory Visit (HOSPITAL_BASED_OUTPATIENT_CLINIC_OR_DEPARTMENT_OTHER): Payer: BLUE CROSS/BLUE SHIELD | Admitting: Hematology & Oncology

## 2014-11-09 ENCOUNTER — Ambulatory Visit (HOSPITAL_BASED_OUTPATIENT_CLINIC_OR_DEPARTMENT_OTHER): Payer: BLUE CROSS/BLUE SHIELD

## 2014-11-09 ENCOUNTER — Other Ambulatory Visit (HOSPITAL_BASED_OUTPATIENT_CLINIC_OR_DEPARTMENT_OTHER): Payer: BLUE CROSS/BLUE SHIELD

## 2014-11-09 ENCOUNTER — Encounter: Payer: Self-pay | Admitting: Hematology & Oncology

## 2014-11-09 VITALS — BP 123/61 | HR 66 | Temp 97.2°F | Resp 14 | Ht 66.0 in | Wt 168.0 lb

## 2014-11-09 DIAGNOSIS — D510 Vitamin B12 deficiency anemia due to intrinsic factor deficiency: Secondary | ICD-10-CM

## 2014-11-09 DIAGNOSIS — G629 Polyneuropathy, unspecified: Secondary | ICD-10-CM

## 2014-11-09 DIAGNOSIS — Z85820 Personal history of malignant melanoma of skin: Secondary | ICD-10-CM

## 2014-11-09 DIAGNOSIS — E538 Deficiency of other specified B group vitamins: Secondary | ICD-10-CM

## 2014-11-09 DIAGNOSIS — D509 Iron deficiency anemia, unspecified: Secondary | ICD-10-CM

## 2014-11-09 LAB — IRON AND TIBC CHCC
%SAT: 29 % (ref 21–57)
Iron: 77 ug/dL (ref 41–142)
TIBC: 269 ug/dL (ref 236–444)
UIBC: 192 ug/dL (ref 120–384)

## 2014-11-09 LAB — CHCC SATELLITE - SMEAR

## 2014-11-09 LAB — CBC WITH DIFFERENTIAL (CANCER CENTER ONLY)
BASO#: 0 10*3/uL (ref 0.0–0.2)
BASO%: 0.9 % (ref 0.0–2.0)
EOS%: 7.9 % — ABNORMAL HIGH (ref 0.0–7.0)
Eosinophils Absolute: 0.4 10*3/uL (ref 0.0–0.5)
HEMATOCRIT: 44.4 % (ref 34.8–46.6)
HGB: 15.3 g/dL (ref 11.6–15.9)
LYMPH#: 1.6 10*3/uL (ref 0.9–3.3)
LYMPH%: 34.5 % (ref 14.0–48.0)
MCH: 30.8 pg (ref 26.0–34.0)
MCHC: 34.5 g/dL (ref 32.0–36.0)
MCV: 90 fL (ref 81–101)
MONO#: 0.2 10*3/uL (ref 0.1–0.9)
MONO%: 5.1 % (ref 0.0–13.0)
NEUT%: 51.6 % (ref 39.6–80.0)
NEUTROS ABS: 2.4 10*3/uL (ref 1.5–6.5)
Platelets: 286 10*3/uL (ref 145–400)
RBC: 4.96 10*6/uL (ref 3.70–5.32)
RDW: 12.7 % (ref 11.1–15.7)
WBC: 4.7 10*3/uL (ref 3.9–10.0)

## 2014-11-09 LAB — CMP (CANCER CENTER ONLY)
ALT: 24 U/L (ref 10–47)
AST: 19 U/L (ref 11–38)
Albumin: 3.7 g/dL (ref 3.3–5.5)
Alkaline Phosphatase: 94 U/L — ABNORMAL HIGH (ref 26–84)
BILIRUBIN TOTAL: 0.7 mg/dL (ref 0.20–1.60)
BUN, Bld: 13 mg/dL (ref 7–22)
CALCIUM: 9 mg/dL (ref 8.0–10.3)
CHLORIDE: 105 meq/L (ref 98–108)
CO2: 25 mEq/L (ref 18–33)
Creat: 1.1 mg/dl (ref 0.6–1.2)
GLUCOSE: 120 mg/dL — AB (ref 73–118)
Potassium: 3.7 mEq/L (ref 3.3–4.7)
Sodium: 142 mEq/L (ref 128–145)
TOTAL PROTEIN: 7.5 g/dL (ref 6.4–8.1)

## 2014-11-09 LAB — VITAMIN B12: Vitamin B-12: 223 pg/mL (ref 211–911)

## 2014-11-09 LAB — RETICULOCYTES (CHCC)
ABS Retic: 40.2 10*3/uL (ref 19.0–186.0)
RBC.: 5.03 MIL/uL (ref 3.87–5.11)
Retic Ct Pct: 0.8 % (ref 0.4–2.3)

## 2014-11-09 LAB — FERRITIN CHCC: Ferritin: 141 ng/ml (ref 9–269)

## 2014-11-09 MED ORDER — CYANOCOBALAMIN 1000 MCG/ML IJ SOLN
INTRAMUSCULAR | Status: AC
  Filled 2014-11-09: qty 1

## 2014-11-09 MED ORDER — CYANOCOBALAMIN 1000 MCG/ML IJ SOLN
1000.0000 ug | Freq: Once | INTRAMUSCULAR | Status: AC
  Administered 2014-11-09: 1000 ug via INTRAMUSCULAR

## 2014-11-09 NOTE — Patient Instructions (Signed)

## 2014-11-09 NOTE — Progress Notes (Signed)
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 She is doing okay. She was last seen back in May.  She's had a decent summer.  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 She has surgery for this last year.  She's working. Work is quite busy.  She's doing well with the B-12 injections. Her last B-12 level in May was a little bit low at 206. Because this, we have been giving her B-12 every month.  Her last iron studies done back in May showed a ferritin of 146 with an iron saturation 28%. No areas well-developed and well-nourished white female in no obvious distress. Head and neck exam shows no ocular or oral lesions. There are no palpable cervical or supraclavicular lymph nodes. Lungs are clear. Cardiac exam regular rate and rhythm with no murmurs, rubs or bruits. Abdomen is soft. She has good bowel sounds. There is no fluid wave. There is no palpable liver or spleen tip. Back exam shows no tenderness over the spine, ribs or hips. Extremities shows no clubbing, cyanosis or edema. Neurological exam shows no focal neurological deficits.  She's had no change in bowel or bladder habits. She is having some irregular cycles. She probably is going through the menopause.  Overall, her performance status is ECOG 0. lbs.    CURRENT TREATMENT: Vitamin B12 1 mg IM monthly IV iron as indicated  REVIEW OF SYSTEMS: All other 10 point review of systems is negative.   PAST MEDICAL  HISTORY: Past Medical History  Diagnosis Date  . Numbness and tingling in hands   . Graves disease   . Weakness   . Anemia   . B12 deficiency   . Iron deficiency anemia, unspecified 12/27/2012    PAST SURGICAL HISTORY: Past Surgical History  Procedure Laterality Date  . None      FAMILY HISTORY Family History  Problem Relation Age of Onset  . High blood pressure Mother   . Osteoarthritis Mother   . Thyroid disease Mother   . Heart failure Father   . Diabetes type II Brother   . Heart disease Father   . Colon cancer Neg Hx     GYNECOLOGIC HISTORY:  No LMP recorded.   SOCIAL HISTORY: History   Social History  . Marital Status: Married    Spouse Name: Herbie Baltimore  . Number of Children: 2  . 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     Social History Main Topics  . Smoking status: Never Smoker   . Smokeless tobacco: Never Used     Comment: Never used tobacco  . Alcohol Use: No  . Drug Use: No  . Sexual Activity: Not on file   Other Topics Concern  . Not on file   Social History Narrative   Patient lives at home at husband Herbie [place]). Patient works full time. Patient has college education.   Right handed.   Caffeine- one cup daily.    ADVANCED DIRECTIVES:  <no information>  HEALTH  MAINTENANCE: History  Substance Use Topics  . Smoking status: Never Smoker   . Smokeless tobacco: Never Used     Comment: Never used tobacco  . Alcohol Use: No   Colonoscopy: PAP: Bone density: Lipid panel:  Allergies  Allergen Reactions  . Vancomycin     Red neck syndrome from IV Vanco.  . Other     narcotics  . Prednisone Other (See Comments)    Patient prefers not to take due to the edginess it gives her  . Epinephrine Palpitations  . Keflex [Cephalexin] Rash    Current Outpatient Prescriptions  Medication Sig Dispense Refill  . Ascorbic Acid (VITAMIN C) 500 MG CAPS Take by mouth every morning.    .  cholecalciferol (VITAMIN D) 1000 UNITS tablet Take 2,000 Units by mouth daily.    . cyanocobalamin (,VITAMIN B-12,) 1000 MCG/ML injection Inject 1,000 mcg into the muscle every 30 (thirty) days.    . folic acid (FOLVITE) 1 MG tablet Take 2 tablets (2 mg total) by mouth daily. 60 tablet 6  . levothyroxine (SYNTHROID, LEVOTHROID) 50 MCG tablet Take 50 mcg by mouth daily.    . magnesium 30 MG tablet Take 30 mg by mouth 1 day or 1 dose.    Marland Kitchen UNABLE TO FIND Apply topically as needed. Kerytin    . vitamin E 100 UNIT capsule Take 100 Units by mouth daily.      No current facility-administered medications for this visit.   Facility-Administered Medications Ordered in Other Visits  Medication Dose Route Frequency Provider Last Rate Last Dose  . cyanocobalamin ((VITAMIN B-12)) injection 1,000 mcg  1,000 mcg Intramuscular Once Volanda Napoleon, MD        OBJECTIVE: Filed Vitals:   11/09/14 0916  BP: 123/61  Pulse: 66  Temp: 97.2 F (36.2 C)  Resp: 14    Filed Weights   11/09/14 0916  Weight: 168 lb (76.204 kg)   LAB RESULTS: CMP     Component Value Date/Time   NA 141 09/21/2012 0920   K 4.3 09/21/2012 0920   CL 104 09/21/2012 0920   CO2 25 09/21/2012 0920   GLUCOSE 95 09/21/2012 0920   BUN 12 09/21/2012 0920   CREATININE 0.78 09/21/2012 0920   CALCIUM 9.2 09/21/2012 0920   PROT 7.0 10/05/2012 0821   AST 16 09/21/2012 0920   ALT 15 09/21/2012 0920   ALKPHOS 79 09/21/2012 0920   BILITOT 0.5 09/21/2012 0920   GFRNONAA 88 09/21/2012 0920   GFRAA 102 09/21/2012 0920   INo results found for: SPEP, UPEP Lab Results  Component Value Date   WBC 4.7 11/09/2014   NEUTROABS 2.4 11/09/2014   HGB 15.3 11/09/2014   HCT 44.4 11/09/2014   MCV 90 11/09/2014   PLT 286 11/09/2014   No results found for: LABCA2 No components found for: [id number] No results for input(s): INR in the last 168 hours. Urinalysis No results found for: COLORURINE, APPEARANCEUR, LABSPEC, PHURINE, GLUCOSEU, HGBUR,  BILIRUBINUR, KETONESUR, PROTEINUR, UROBILINOGEN, NITRITE, LEUKOCYTESUR STUDIES: No results found.  ASSESSMENT/PLAN: Ms. Katherine Bradford is 54 year old female with pernicious anemia. She has positive intrinsic factor antibodies.  We will see what her iron studies show.  She gets her vitamin B12 monthly.   I will plan to see her back myself in about 3 months. Volanda Napoleon, MD 11/09/2014 9:46 AM

## 2014-12-07 ENCOUNTER — Ambulatory Visit (HOSPITAL_BASED_OUTPATIENT_CLINIC_OR_DEPARTMENT_OTHER): Payer: BLUE CROSS/BLUE SHIELD

## 2014-12-07 DIAGNOSIS — D510 Vitamin B12 deficiency anemia due to intrinsic factor deficiency: Secondary | ICD-10-CM | POA: Diagnosis not present

## 2014-12-07 MED ORDER — CYANOCOBALAMIN 1000 MCG/ML IJ SOLN
1000.0000 ug | Freq: Once | INTRAMUSCULAR | Status: AC
  Administered 2014-12-07: 1000 ug via INTRAMUSCULAR

## 2014-12-07 NOTE — Patient Instructions (Signed)

## 2014-12-10 ENCOUNTER — Ambulatory Visit: Payer: BLUE CROSS/BLUE SHIELD

## 2015-01-10 ENCOUNTER — Ambulatory Visit: Payer: BLUE CROSS/BLUE SHIELD

## 2015-01-11 ENCOUNTER — Ambulatory Visit (HOSPITAL_BASED_OUTPATIENT_CLINIC_OR_DEPARTMENT_OTHER): Payer: BLUE CROSS/BLUE SHIELD

## 2015-01-11 VITALS — BP 118/73 | HR 76 | Temp 97.8°F | Resp 16

## 2015-01-11 DIAGNOSIS — D510 Vitamin B12 deficiency anemia due to intrinsic factor deficiency: Secondary | ICD-10-CM

## 2015-01-11 DIAGNOSIS — D509 Iron deficiency anemia, unspecified: Secondary | ICD-10-CM

## 2015-01-11 MED ORDER — CYANOCOBALAMIN 1000 MCG/ML IJ SOLN
INTRAMUSCULAR | Status: AC
  Filled 2015-01-11: qty 1

## 2015-01-11 MED ORDER — CYANOCOBALAMIN 1000 MCG/ML IJ SOLN
1000.0000 ug | Freq: Once | INTRAMUSCULAR | Status: AC
Start: 2015-01-11 — End: 2015-01-11
  Administered 2015-01-11: 1000 ug via INTRAMUSCULAR

## 2015-01-11 NOTE — Patient Instructions (Signed)

## 2015-02-08 ENCOUNTER — Encounter: Payer: Self-pay | Admitting: Family

## 2015-02-08 ENCOUNTER — Ambulatory Visit (HOSPITAL_BASED_OUTPATIENT_CLINIC_OR_DEPARTMENT_OTHER): Payer: BLUE CROSS/BLUE SHIELD | Admitting: Family

## 2015-02-08 ENCOUNTER — Ambulatory Visit (HOSPITAL_BASED_OUTPATIENT_CLINIC_OR_DEPARTMENT_OTHER): Payer: BLUE CROSS/BLUE SHIELD

## 2015-02-08 ENCOUNTER — Other Ambulatory Visit (HOSPITAL_BASED_OUTPATIENT_CLINIC_OR_DEPARTMENT_OTHER): Payer: BLUE CROSS/BLUE SHIELD

## 2015-02-08 VITALS — BP 98/58 | HR 79 | Temp 97.9°F | Resp 16 | Ht 66.0 in | Wt 166.0 lb

## 2015-02-08 DIAGNOSIS — E538 Deficiency of other specified B group vitamins: Secondary | ICD-10-CM

## 2015-02-08 DIAGNOSIS — D510 Vitamin B12 deficiency anemia due to intrinsic factor deficiency: Secondary | ICD-10-CM | POA: Diagnosis not present

## 2015-02-08 DIAGNOSIS — Z85820 Personal history of malignant melanoma of skin: Secondary | ICD-10-CM

## 2015-02-08 DIAGNOSIS — D509 Iron deficiency anemia, unspecified: Secondary | ICD-10-CM | POA: Diagnosis not present

## 2015-02-08 DIAGNOSIS — G629 Polyneuropathy, unspecified: Secondary | ICD-10-CM | POA: Insufficient documentation

## 2015-02-08 LAB — COMPREHENSIVE METABOLIC PANEL (CC13)
ALT: 22 U/L (ref 0–55)
AST: 17 U/L (ref 5–34)
Albumin: 4.1 g/dL (ref 3.5–5.0)
Alkaline Phosphatase: 90 U/L (ref 40–150)
Anion Gap: 8 mEq/L (ref 3–11)
BUN: 14.4 mg/dL (ref 7.0–26.0)
CALCIUM: 10.3 mg/dL (ref 8.4–10.4)
CHLORIDE: 107 meq/L (ref 98–109)
CO2: 29 meq/L (ref 22–29)
CREATININE: 0.9 mg/dL (ref 0.6–1.1)
EGFR: 78 mL/min/{1.73_m2} — ABNORMAL LOW (ref >90)
GLUCOSE: 82 mg/dL (ref 70–140)
POTASSIUM: 4.4 meq/L (ref 3.5–5.1)
SODIUM: 143 meq/L (ref 136–145)
Total Bilirubin: 0.42 mg/dL (ref 0.20–1.20)
Total Protein: 7.5 g/dL (ref 6.4–8.3)

## 2015-02-08 LAB — IRON AND TIBC CHCC
%SAT: 28 % (ref 21–57)
Iron: 79 ug/dL (ref 41–142)
TIBC: 285 ug/dL (ref 236–444)
UIBC: 206 ug/dL (ref 120–384)

## 2015-02-08 LAB — FERRITIN CHCC: Ferritin: 141 ng/ml (ref 9–269)

## 2015-02-08 LAB — CBC WITH DIFFERENTIAL (CANCER CENTER ONLY)
BASO#: 0 10*3/uL (ref 0.0–0.2)
BASO%: 0.6 % (ref 0.0–2.0)
EOS%: 8 % — ABNORMAL HIGH (ref 0.0–7.0)
Eosinophils Absolute: 0.4 10*3/uL (ref 0.0–0.5)
HCT: 45.8 % (ref 34.8–46.6)
HGB: 15.6 g/dL (ref 11.6–15.9)
LYMPH#: 1.8 10*3/uL (ref 0.9–3.3)
LYMPH%: 36.2 % (ref 14.0–48.0)
MCH: 29.9 pg (ref 26.0–34.0)
MCHC: 34.1 g/dL (ref 32.0–36.0)
MCV: 88 fL (ref 81–101)
MONO#: 0.4 10*3/uL (ref 0.1–0.9)
MONO%: 8.2 % (ref 0.0–13.0)
NEUT#: 2.3 10*3/uL (ref 1.5–6.5)
NEUT%: 47 % (ref 39.6–80.0)
PLATELETS: 244 10*3/uL (ref 145–400)
RBC: 5.22 10*6/uL (ref 3.70–5.32)
RDW: 12.4 % (ref 11.1–15.7)
WBC: 4.9 10*3/uL (ref 3.9–10.0)

## 2015-02-08 LAB — CHCC SATELLITE - SMEAR

## 2015-02-08 LAB — VITAMIN B12: Vitamin B-12: 228 pg/mL (ref 211–911)

## 2015-02-08 LAB — RETICULOCYTES (CHCC)
ABS RETIC: 72.5 10*3/uL (ref 19.0–186.0)
RBC.: 5.18 MIL/uL — AB (ref 3.87–5.11)
RETIC CT PCT: 1.4 % (ref 0.4–2.3)

## 2015-02-08 MED ORDER — CYANOCOBALAMIN 1000 MCG/ML IJ SOLN
INTRAMUSCULAR | Status: AC
  Filled 2015-02-08: qty 1

## 2015-02-08 MED ORDER — CYANOCOBALAMIN 1000 MCG/ML IJ SOLN
1000.0000 ug | Freq: Once | INTRAMUSCULAR | Status: AC
  Administered 2015-02-08: 1000 ug via INTRAMUSCULAR

## 2015-02-08 NOTE — Patient Instructions (Signed)

## 2015-02-08 NOTE — Progress Notes (Signed)
Hematology and Oncology Follow Up Visit  Katherine Bradford [id number] 1961/03/01 54 y.o. 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 She is doing well and has no complaints at this time. She gets B 12 injections monthly and denies fatigue at this time. Her last dose of Feraheme was in August 2015. In July of this year her iron saturation was 29% with a ferritin of 141.  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 This is unchanged. No swelling or tenderness in her extremities.  No lymphadenopathy found on exam.  She continues to see Dermatology every 6 months. She has not had any suspicious areas removed in a while. She has not noticed any changes with her skin.  There has been no problem with infections. No fever, chills, n/v, cough, rash, dizziness, SOB, chest pain, palpitations, abdominal pain or changes in her bowel or bladder habits.  She has a good appetite and is staying well hydrated. Her weight is stable.   Medications:    Medication List       This list is accurate as of: 02/08/15 11:12 AM.  Always use your most recent med list.               calcium carbonate 500 MG chewable tablet  Commonly known as:  TUMS - dosed in mg elemental calcium  Chew 1 tablet by mouth daily.     cholecalciferol 1000 UNITS tablet  Commonly known as:  VITAMIN D  Take 2,000 Units by mouth daily.     cyanocobalamin 1000 MCG/ML injection  Commonly known as:  (VITAMIN B-12)  Inject 1,000 mcg into the muscle every 30 (thirty) days.     folic acid 1 MG tablet  Commonly known as:  FOLVITE  Take 2 tablets (2 mg total) by mouth daily.     levothyroxine 50  MCG tablet  Commonly known as:  SYNTHROID, LEVOTHROID  Take 50 mcg by mouth daily.     magnesium 30 MG tablet  Take 30 mg by mouth 1 day or 1 dose.     UNABLE TO FIND  Apply topically as needed. Kerytin     Vitamin C 500 MG Caps  Take by mouth every morning.     vitamin E 100 UNIT capsule  Take 100 Units by mouth daily.        Allergies:  Allergies  Allergen Reactions  . Vancomycin     Red neck syndrome from IV Vanco.  . Other     narcotics  . Prednisone Other (See Comments)    Patient prefers not to take due to the edginess it gives her  . Epinephrine Palpitations  . Keflex [Cephalexin] Rash    Past Medical History, Surgical history, Social history, and Family History were reviewed and updated.  Review of Systems: All other 10 point review of systems is negative.   Physical Exam:  height is 5\' 6"  (1.676 m) and weight is 166 lb (75.297 kg). Her oral temperature is 97.9 F (36.6 C). Her blood pressure is 98/58 and her pulse is 79. Her respiration is 16.   Wt Readings from Last 3 Encounters:  02/08/15 166 lb (75.297 kg)  11/09/14 168 lb (76.204 kg)  10/12/14 167 lb (75.751 kg)    Ocular: Sclerae unicteric, pupils equal, round and reactive to light Ear-nose-throat: Oropharynx clear, dentition fair Lymphatic: No cervical or supraclavicular adenopathy Lungs no rales or rhonchi, good excursion bilaterally Heart regular rate and rhythm, no murmur appreciated Abd soft, nontender, positive bowel sounds, no organomegaly MSK no focal spinal tenderness, no joint edema Neuro: non-focal, well-oriented, appropriate affect Breasts: Deferred  Lab Results  Component Value Date   WBC 4.9 02/08/2015   HGB 15.6 02/08/2015   HCT 45.8 02/08/2015   MCV 88 02/08/2015   PLT 244 02/08/2015   Lab Results  Component Value Date   FERRITIN 141 11/09/2014   IRON 77 11/09/2014   TIBC 269 11/09/2014   UIBC 192 11/09/2014   IRONPCTSAT 29 11/09/2014   Lab Results  Component  Value Date   RETICCTPCT 0.8 11/09/2014   RBC 5.22 02/08/2015   RETICCTABS 40.2 11/09/2014   No results found for: Nils Pyle [facility] Lab Results  Component Value Date   IGGSERUM 1200 10/05/2012   IGA 251 10/17/2012   IGA 267 10/17/2012   IGMSERUM 90 10/05/2012   No results found for: Kathrynn Ducking, MSPIKE, SPEI   Chemistry      Component Value Date/Time   NA 142 11/09/2014 0859   NA 141 09/21/2012 0920   K 3.7 11/09/2014 0859   K 4.3 09/21/2012 0920   CL 105 11/09/2014 0859   CL 104 09/21/2012 0920   CO2 25 11/09/2014 0859   CO2 25 09/21/2012 0920   BUN 13 11/09/2014 0859   BUN 12 09/21/2012 0920   CREATININE 1.1 11/09/2014 0859   CREATININE 0.78 09/21/2012 0920      Component Value Date/Time   CALCIUM 9.0 11/09/2014 0859   CALCIUM 9.2 09/21/2012 0920   ALKPHOS 94* 11/09/2014 0859   ALKPHOS 79 09/21/2012 0920   AST 19 11/09/2014 0859   AST 16 09/21/2012 0920   ALT 24 11/09/2014 0859   ALT 15 09/21/2012 0920   BILITOT 0.70 11/09/2014 0859   BILITOT 0.5 09/21/2012 0920     Impression and Plan: Katherine Bradford is a pleasant 54 yo white female with multifactorial anemia and positive intrinsic factor antibodies. She has done well with B 12 injections monthly and is asymptomatic at this time.  She also has a history of stage 1A melanoma of the right inner thigh. She is followed closely by dermatology every 6 months and so far there has been no evidence of recurrence.  She has not had to have Feraheme in over a year. We will see what her iron studies show.  We will plan to see her back in 3 months for labs and follow-up.  She will contact us with any questions or concerns. We can certainly see her sooner if need be.   Eliezer Bottom, NP 10/28/201611:12 AM

## 2015-03-08 ENCOUNTER — Ambulatory Visit (HOSPITAL_BASED_OUTPATIENT_CLINIC_OR_DEPARTMENT_OTHER): Payer: BLUE CROSS/BLUE SHIELD

## 2015-03-08 VITALS — BP 120/78 | HR 75 | Temp 97.5°F | Resp 14

## 2015-03-08 DIAGNOSIS — D510 Vitamin B12 deficiency anemia due to intrinsic factor deficiency: Secondary | ICD-10-CM

## 2015-03-08 DIAGNOSIS — D509 Iron deficiency anemia, unspecified: Secondary | ICD-10-CM

## 2015-03-08 MED ORDER — CYANOCOBALAMIN 1000 MCG/ML IJ SOLN
1000.0000 ug | Freq: Once | INTRAMUSCULAR | Status: AC
  Administered 2015-03-08: 1000 ug via INTRAMUSCULAR

## 2015-03-08 MED ORDER — CYANOCOBALAMIN 1000 MCG/ML IJ SOLN
INTRAMUSCULAR | Status: AC
  Filled 2015-03-08: qty 1

## 2015-03-08 NOTE — Patient Instructions (Signed)

## 2015-03-21 ENCOUNTER — Other Ambulatory Visit: Payer: Self-pay

## 2015-03-21 DIAGNOSIS — Z1231 Encounter for screening mammogram for malignant neoplasm of breast: Secondary | ICD-10-CM

## 2015-04-05 ENCOUNTER — Ambulatory Visit (HOSPITAL_BASED_OUTPATIENT_CLINIC_OR_DEPARTMENT_OTHER): Payer: BLUE CROSS/BLUE SHIELD

## 2015-04-05 VITALS — BP 123/65 | HR 70 | Temp 98.0°F | Resp 18

## 2015-04-05 DIAGNOSIS — D509 Iron deficiency anemia, unspecified: Secondary | ICD-10-CM | POA: Diagnosis not present

## 2015-04-05 MED ORDER — CYANOCOBALAMIN 1000 MCG/ML IJ SOLN
1000.0000 ug | Freq: Once | INTRAMUSCULAR | Status: AC
  Administered 2015-04-05: 1000 ug via INTRAMUSCULAR

## 2015-04-05 MED ORDER — CYANOCOBALAMIN 1000 MCG/ML IJ SOLN
INTRAMUSCULAR | Status: AC
  Filled 2015-04-05: qty 1

## 2015-04-05 NOTE — Patient Instructions (Signed)

## 2015-04-26 ENCOUNTER — Ambulatory Visit
Admission: RE | Admit: 2015-04-26 | Discharge: 2015-04-26 | Disposition: A | Discharge status: Home / Self Care | Payer: BLUE CROSS/BLUE SHIELD | Source: Ambulatory Visit

## 2015-04-26 DIAGNOSIS — Z1231 Encounter for screening mammogram for malignant neoplasm of breast: Secondary | ICD-10-CM

## 2015-05-03 ENCOUNTER — Other Ambulatory Visit (HOSPITAL_BASED_OUTPATIENT_CLINIC_OR_DEPARTMENT_OTHER): Payer: BLUE CROSS/BLUE SHIELD

## 2015-05-03 ENCOUNTER — Encounter: Payer: Self-pay | Admitting: Hematology & Oncology

## 2015-05-03 ENCOUNTER — Ambulatory Visit (HOSPITAL_BASED_OUTPATIENT_CLINIC_OR_DEPARTMENT_OTHER): Payer: BLUE CROSS/BLUE SHIELD

## 2015-05-03 ENCOUNTER — Ambulatory Visit (HOSPITAL_BASED_OUTPATIENT_CLINIC_OR_DEPARTMENT_OTHER): Payer: BLUE CROSS/BLUE SHIELD | Admitting: Hematology & Oncology

## 2015-05-03 VITALS — BP 121/63 | HR 76 | Temp 97.8°F | Resp 16 | Ht 66.0 in | Wt 169.0 lb

## 2015-05-03 DIAGNOSIS — E0500 Thyrotoxicosis with diffuse goiter without thyrotoxic crisis or storm: Secondary | ICD-10-CM

## 2015-05-03 DIAGNOSIS — D509 Iron deficiency anemia, unspecified: Secondary | ICD-10-CM

## 2015-05-03 DIAGNOSIS — E538 Deficiency of other specified B group vitamins: Secondary | ICD-10-CM

## 2015-05-03 DIAGNOSIS — D510 Vitamin B12 deficiency anemia due to intrinsic factor deficiency: Secondary | ICD-10-CM

## 2015-05-03 LAB — CBC WITH DIFFERENTIAL (CANCER CENTER ONLY)
BASO#: 0 10*3/uL (ref 0.0–0.2)
BASO%: 0.5 % (ref 0.0–2.0)
EOS%: 3.8 % (ref 0.0–7.0)
Eosinophils Absolute: 0.2 10*3/uL (ref 0.0–0.5)
HEMATOCRIT: 45.8 % (ref 34.8–46.6)
HGB: 15.5 g/dL (ref 11.6–15.9)
LYMPH#: 1.6 10*3/uL (ref 0.9–3.3)
LYMPH%: 28.4 % (ref 14.0–48.0)
MCH: 29.4 pg (ref 26.0–34.0)
MCHC: 33.8 g/dL (ref 32.0–36.0)
MCV: 87 fL (ref 81–101)
MONO#: 0.4 10*3/uL (ref 0.1–0.9)
MONO%: 7.8 % (ref 0.0–13.0)
NEUT#: 3.3 10*3/uL (ref 1.5–6.5)
NEUT%: 59.5 % (ref 39.6–80.0)
PLATELETS: 241 10*3/uL (ref 145–400)
RBC: 5.27 10*6/uL (ref 3.70–5.32)
RDW: 12.8 % (ref 11.1–15.7)
WBC: 5.5 10*3/uL (ref 3.9–10.0)

## 2015-05-03 LAB — COMPREHENSIVE METABOLIC PANEL
ALK PHOS: 93 U/L (ref 40–150)
ALT: 17 U/L (ref 0–55)
ANION GAP: 8 meq/L (ref 3–11)
AST: 17 U/L (ref 5–34)
Albumin: 4 g/dL (ref 3.5–5.0)
BILIRUBIN TOTAL: 0.46 mg/dL (ref 0.20–1.20)
BUN: 12.7 mg/dL (ref 7.0–26.0)
CO2: 26 meq/L (ref 22–29)
Calcium: 9.4 mg/dL (ref 8.4–10.4)
Chloride: 105 mEq/L (ref 98–109)
Creatinine: 1 mg/dL (ref 0.6–1.1)
EGFR: 68 mL/min/{1.73_m2} — AB (ref >90)
GLUCOSE: 102 mg/dL (ref 70–140)
POTASSIUM: 4.1 meq/L (ref 3.5–5.1)
SODIUM: 139 meq/L (ref 136–145)
TOTAL PROTEIN: 7.3 g/dL (ref 6.4–8.3)

## 2015-05-03 LAB — IRON AND TIBC
%SAT: 20 % — AB (ref 21–57)
IRON: 56 ug/dL (ref 41–142)
TIBC: 279 ug/dL (ref 236–444)
UIBC: 222 ug/dL (ref 120–384)

## 2015-05-03 LAB — FERRITIN: Ferritin: 90 ng/ml (ref 9–269)

## 2015-05-03 LAB — CHCC SATELLITE - SMEAR

## 2015-05-03 MED ORDER — CYANOCOBALAMIN 1000 MCG/ML IJ SOLN
INTRAMUSCULAR | Status: AC
  Filled 2015-05-03: qty 1

## 2015-05-03 MED ORDER — CYANOCOBALAMIN 1000 MCG/ML IJ SOLN
1000.0000 ug | Freq: Once | INTRAMUSCULAR | Status: AC
Start: 2015-05-03 — End: 2015-05-03
  Administered 2015-05-03: 1000 ug via INTRAMUSCULAR

## 2015-05-03 NOTE — Progress Notes (Signed)
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 She is doing okay. She was last seen back in October..  She had no problems over the holidays. She's worried about her weight gain. She did eat quite a lot. She is going to the gym after she sees [place].  She still working in a [employer]. She is doing well there. There she still has the neuropathy in her hands. She underwent surgery for the bulging neck disc I think in April 2015. This helped a little bit but still, I think that she will always have the neuropathy. She had a mammogram last week. Everything looked fine.  She's still having her monthly cycles. I'm somewhat surprised by this. I told her that this actually is still protective up for her bones. I know does increase her risk of breast cancer. It might decrease the risk of ovarian cancer.   She's had no change in bowel or bladder habits.  Overall, her performance status is ECOG 0. lbs.    CURRENT TREATMENT: Vitamin B12 1 mg IM monthly IV iron as indicated  REVIEW OF SYSTEMS: All other 10 point review of systems is negative.   PAST MEDICAL HISTORY: Past Medical History  Diagnosis Date  . Numbness and tingling in hands   . Graves disease   . Weakness   . Anemia   . B12 deficiency   . Iron deficiency anemia, unspecified 12/27/2012    PAST SURGICAL HISTORY: Past Surgical History  Procedure Laterality Date  . None      FAMILY HISTORY Family History  Problem Relation Age of Onset  . High blood pressure Mother   .  Osteoarthritis Mother   . Thyroid disease Mother   . Heart failure Father   . Diabetes type II Brother   . Heart disease Father   . Colon cancer Neg Hx     GYNECOLOGIC HISTORY:  No LMP recorded.   SOCIAL HISTORY: Social History   Social History  . Marital Status: Married    Spouse Name: Katherine Bradford  . Number of Children: 2  . 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     Social History Main Topics  . Smoking status: Never Smoker   . Smokeless tobacco: Never Used     Comment: Never used tobacco  . Alcohol Use: No  . Drug Use: No  . Sexual Activity: Not on file   Other Topics Concern  . Not on file   Social History Narrative   Patient lives at home at husband Katherine Bradford). Patient works full time. Patient has college education.   Right handed.   Caffeine- one cup daily.    ADVANCED DIRECTIVES:  <no information>  HEALTH MAINTENANCE: Social History  Substance Use Topics  . Smoking status: Never Smoker   . Smokeless tobacco: Never Used     Comment: Never used tobacco  . Alcohol Use: No   Colonoscopy: PAP: Bone density: Lipid panel:  Allergies  Allergen Reactions  . Vancomycin     Red neck syndrome from IV Vanco.  .  Other     narcotics  . Prednisone Other (See Comments)    Patient prefers not to take due to the edginess it gives her  . Epinephrine Palpitations  . Keflex [Cephalexin] Rash    Current Outpatient Prescriptions  Medication Sig Dispense Refill  . Ascorbic Acid (VITAMIN C) 500 MG CAPS Take by mouth every morning.    . calcium carbonate (TUMS - DOSED IN MG ELEMENTAL CALCIUM) 500 MG chewable tablet Chew 1 tablet by mouth daily.    . cholecalciferol (VITAMIN D) 1000 UNITS tablet Take 2,000 Units by mouth daily.    . cyanocobalamin (,VITAMIN B-12,) 1000 MCG/ML injection Inject 1,000 mcg into the muscle every 30 (thirty) days.    . folic acid (FOLVITE) 1 MG tablet Take 2 tablets (2 mg total) by  mouth daily. 60 tablet 6  . magnesium 30 MG tablet Take 30 mg by mouth 1 day or 1 dose.    Marland Kitchen UNABLE TO FIND Apply topically as needed. Kerytin    . vitamin E 100 UNIT capsule Take 100 Units by mouth daily.      No current facility-administered medications for this visit.    OBJECTIVE: Filed Vitals:   05/03/15 0841  BP: 121/63  Pulse: 76  Temp: 97.8 F (36.6 C)  Resp: 16    Filed Weights   05/03/15 0841  Weight: 169 lb (76.658 kg)   LAB RESULTS: CMP     Component Value Date/Time   NA 143 02/08/2015 1035   NA 142 11/09/2014 0859   NA 141 09/21/2012 0920   K 4.4 02/08/2015 1035   K 3.7 11/09/2014 0859   K 4.3 09/21/2012 0920   CL 105 11/09/2014 0859   CL 104 09/21/2012 0920   CO2 29 02/08/2015 1035   CO2 25 11/09/2014 0859   CO2 25 09/21/2012 0920   GLUCOSE 82 02/08/2015 1035   GLUCOSE 120* 11/09/2014 0859   GLUCOSE 95 09/21/2012 0920   BUN 14.4 02/08/2015 1035   BUN 13 11/09/2014 0859   BUN 12 09/21/2012 0920   CREATININE 0.9 02/08/2015 1035   CREATININE 1.1 11/09/2014 0859   CREATININE 0.78 09/21/2012 0920   CALCIUM 10.3 02/08/2015 1035   CALCIUM 9.0 11/09/2014 0859   CALCIUM 9.2 09/21/2012 0920   PROT 7.5 02/08/2015 1035   PROT 7.5 11/09/2014 0859   PROT 7.0 10/05/2012 0821   ALBUMIN 4.1 02/08/2015 1035   ALBUMIN 3.7 11/09/2014 0859   AST 17 02/08/2015 1035   AST 19 11/09/2014 0859   AST 16 09/21/2012 0920   ALT 22 02/08/2015 1035   ALT 24 11/09/2014 0859   ALT 15 09/21/2012 0920   ALKPHOS 90 02/08/2015 1035   ALKPHOS 94* 11/09/2014 0859   ALKPHOS 79 09/21/2012 0920   BILITOT 0.42 02/08/2015 1035   BILITOT 0.70 11/09/2014 0859   BILITOT 0.5 09/21/2012 0920   GFRNONAA 88 09/21/2012 0920   GFRAA 102 09/21/2012 0920   INo results found for: SPEP, UPEP Lab Results  Component Value Date   WBC 5.5 05/03/2015   NEUTROABS 3.3 05/03/2015   HGB 15.5 05/03/2015   HCT 45.8 05/03/2015   MCV 87 05/03/2015   PLT 241 05/03/2015   No results found for:  LABCA2 No components found for: LABCA125 No results for input(s): INR in the last 168 hours. Urinalysis No results found for: COLORURINE, APPEARANCEUR, LABSPEC, PHURINE, GLUCOSEU, HGBUR, BILIRUBINUR, KETONESUR, PROTEINUR, UROBILINOGEN, NITRITE, LEUKOCYTESUR STUDIES: Mm Screening Breast Tomo Bilateral  04/26/2015  CLINICAL DATA:  Screening. EXAM:  DIGITAL SCREENING BILATERAL MAMMOGRAM WITH 3D TOMO WITH CAD COMPARISON:  Previous exam(s). ACR Breast Density Category c: The breast tissue is heterogeneously dense, which may obscure small masses. FINDINGS: There are no findings suspicious for malignancy. Images were processed with CAD. IMPRESSION: No mammographic evidence of malignancy. A result letter of this screening mammogram will be mailed directly to the patient. RECOMMENDATION: Screening mammogram in one year. (Code:SM-B-01Y) BI-RADS CATEGORY  1: Negative. Electronically Signed   By: Everlean Alstrom M.D.   On: 04/26/2015 13:01    ASSESSMENT/PLAN: Ms. Kleban is 54 year old female with pernicious anemia. She has positive intrinsic factor antibodies.  We will see what her iron studies show.  She gets her vitamin B12 monthly.   I will plan to see her back myself in about 3 months. Volanda Napoleon, MD 05/03/2015 8:54 AM

## 2015-05-03 NOTE — Patient Instructions (Signed)

## 2015-05-04 LAB — RETICULOCYTES: Reticulocyte Count: 1.1 % (ref 0.6–2.6)

## 2015-05-04 LAB — VITAMIN B12: Vitamin B12: 163 pg/mL — ABNORMAL LOW (ref 211–946)

## 2015-06-07 ENCOUNTER — Ambulatory Visit (HOSPITAL_BASED_OUTPATIENT_CLINIC_OR_DEPARTMENT_OTHER): Payer: BLUE CROSS/BLUE SHIELD

## 2015-06-07 VITALS — BP 132/72 | HR 78 | Temp 98.0°F | Resp 16

## 2015-06-07 DIAGNOSIS — D509 Iron deficiency anemia, unspecified: Secondary | ICD-10-CM

## 2015-06-07 DIAGNOSIS — D510 Vitamin B12 deficiency anemia due to intrinsic factor deficiency: Secondary | ICD-10-CM

## 2015-06-07 DIAGNOSIS — E0500 Thyrotoxicosis with diffuse goiter without thyrotoxic crisis or storm: Secondary | ICD-10-CM

## 2015-06-07 LAB — CBC WITH DIFFERENTIAL (CANCER CENTER ONLY)
BASO#: 0 10*3/uL (ref 0.0–0.2)
BASO%: 0.6 % (ref 0.0–2.0)
EOS ABS: 0.3 10*3/uL (ref 0.0–0.5)
EOS%: 6 % (ref 0.0–7.0)
HCT: 47 % — ABNORMAL HIGH (ref 34.8–46.6)
HEMOGLOBIN: 15.8 g/dL (ref 11.6–15.9)
LYMPH#: 1.7 10*3/uL (ref 0.9–3.3)
LYMPH%: 33.3 % (ref 14.0–48.0)
MCH: 29 pg (ref 26.0–34.0)
MCHC: 33.6 g/dL (ref 32.0–36.0)
MCV: 86 fL (ref 81–101)
MONO#: 0.3 10*3/uL (ref 0.1–0.9)
MONO%: 6.8 % (ref 0.0–13.0)
NEUT%: 53.3 % (ref 39.6–80.0)
NEUTROS ABS: 2.7 10*3/uL (ref 1.5–6.5)
Platelets: 242 10*3/uL (ref 145–400)
RBC: 5.45 10*6/uL — AB (ref 3.70–5.32)
RDW: 12.8 % (ref 11.1–15.7)
WBC: 5 10*3/uL (ref 3.9–10.0)

## 2015-06-07 LAB — IRON AND TIBC
%SAT: 29 % (ref 21–57)
Iron: 85 ug/dL (ref 41–142)
TIBC: 290 ug/dL (ref 236–444)
UIBC: 205 ug/dL (ref 120–384)

## 2015-06-07 LAB — FERRITIN: Ferritin: 91 ng/ml (ref 9–269)

## 2015-06-07 MED ORDER — CYANOCOBALAMIN 1000 MCG/ML IJ SOLN
1000.0000 ug | Freq: Once | INTRAMUSCULAR | Status: AC
  Administered 2015-06-07: 1000 ug via INTRAMUSCULAR

## 2015-06-07 MED ORDER — CYANOCOBALAMIN 1000 MCG/ML IJ SOLN
INTRAMUSCULAR | Status: AC
  Filled 2015-06-07: qty 1

## 2015-06-07 NOTE — Patient Instructions (Signed)

## 2015-06-08 LAB — FOLLICLE STIMULATING HORMONE: FSH: 80.9 m[IU]/mL

## 2015-06-08 LAB — VITAMIN B12: VITAMIN B 12: 211 pg/mL (ref 211–946)

## 2015-06-08 LAB — LUTEINIZING HORMONE: LH: 45.3 m[IU]/mL

## 2015-07-04 ENCOUNTER — Other Ambulatory Visit: Payer: Self-pay | Admitting: *Deleted

## 2015-07-04 DIAGNOSIS — D509 Iron deficiency anemia, unspecified: Secondary | ICD-10-CM

## 2015-07-04 DIAGNOSIS — E538 Deficiency of other specified B group vitamins: Secondary | ICD-10-CM

## 2015-07-05 ENCOUNTER — Other Ambulatory Visit (HOSPITAL_BASED_OUTPATIENT_CLINIC_OR_DEPARTMENT_OTHER): Payer: BLUE CROSS/BLUE SHIELD

## 2015-07-05 ENCOUNTER — Ambulatory Visit (HOSPITAL_BASED_OUTPATIENT_CLINIC_OR_DEPARTMENT_OTHER): Payer: BLUE CROSS/BLUE SHIELD

## 2015-07-05 VITALS — BP 136/84 | HR 86 | Temp 98.2°F | Resp 16

## 2015-07-05 DIAGNOSIS — D510 Vitamin B12 deficiency anemia due to intrinsic factor deficiency: Secondary | ICD-10-CM

## 2015-07-05 DIAGNOSIS — D509 Iron deficiency anemia, unspecified: Secondary | ICD-10-CM

## 2015-07-05 DIAGNOSIS — E538 Deficiency of other specified B group vitamins: Secondary | ICD-10-CM

## 2015-07-05 LAB — CBC WITH DIFFERENTIAL (CANCER CENTER ONLY)
BASO#: 0 10*3/uL (ref 0.0–0.2)
BASO%: 0.6 % (ref 0.0–2.0)
EOS ABS: 0.4 10*3/uL (ref 0.0–0.5)
EOS%: 7.8 % — ABNORMAL HIGH (ref 0.0–7.0)
HEMATOCRIT: 46.7 % — AB (ref 34.8–46.6)
HGB: 15.9 g/dL (ref 11.6–15.9)
LYMPH#: 1.9 10*3/uL (ref 0.9–3.3)
LYMPH%: 36.2 % (ref 14.0–48.0)
MCH: 29.3 pg (ref 26.0–34.0)
MCHC: 34 g/dL (ref 32.0–36.0)
MCV: 86 fL (ref 81–101)
MONO#: 0.4 10*3/uL (ref 0.1–0.9)
MONO%: 6.9 % (ref 0.0–13.0)
NEUT#: 2.6 10*3/uL (ref 1.5–6.5)
NEUT%: 48.5 % (ref 39.6–80.0)
Platelets: 240 10*3/uL (ref 145–400)
RBC: 5.42 10*6/uL — ABNORMAL HIGH (ref 3.70–5.32)
RDW: 13 % (ref 11.1–15.7)
WBC: 5.3 10*3/uL (ref 3.9–10.0)

## 2015-07-05 LAB — IRON AND TIBC
%SAT: 30 % (ref 21–57)
Iron: 91 ug/dL (ref 41–142)
TIBC: 299 ug/dL (ref 236–444)
UIBC: 208 ug/dL (ref 120–384)

## 2015-07-05 LAB — FERRITIN: Ferritin: 103 ng/ml (ref 9–269)

## 2015-07-05 MED ORDER — CYANOCOBALAMIN 1000 MCG/ML IJ SOLN
1000.0000 ug | Freq: Once | INTRAMUSCULAR | Status: AC
  Administered 2015-07-05: 1000 ug via INTRAMUSCULAR

## 2015-07-05 MED ORDER — CYANOCOBALAMIN 1000 MCG/ML IJ SOLN
INTRAMUSCULAR | Status: AC
  Filled 2015-07-05: qty 1

## 2015-07-05 NOTE — Patient Instructions (Signed)

## 2015-07-06 LAB — VITAMIN B12: Vitamin B12: 182 pg/mL — ABNORMAL LOW (ref 211–946)

## 2015-07-06 LAB — RETICULOCYTES: Reticulocyte Count: 1.1 % (ref 0.6–2.6)

## 2015-07-08 ENCOUNTER — Telehealth: Payer: Self-pay | Admitting: *Deleted

## 2015-07-08 NOTE — Telephone Encounter (Addendum)
Patient aware of results. Message sent to scheduler to schedule increased injection appointments.   ----- Message from Volanda Napoleon, MD sent at 07/05/2015  5:32 PM EDT ----- Call - iron levels are very good!!!  Laurey Arrow  Per conversation with Dr Marin Olp, patient is to come in every two weeks for vitamin B12 injections due to low vitamin B12 levels.

## 2015-07-19 ENCOUNTER — Ambulatory Visit (HOSPITAL_BASED_OUTPATIENT_CLINIC_OR_DEPARTMENT_OTHER): Payer: BLUE CROSS/BLUE SHIELD

## 2015-07-19 VITALS — BP 132/65 | HR 79 | Temp 97.9°F | Resp 18

## 2015-07-19 DIAGNOSIS — D510 Vitamin B12 deficiency anemia due to intrinsic factor deficiency: Secondary | ICD-10-CM

## 2015-07-19 DIAGNOSIS — D509 Iron deficiency anemia, unspecified: Secondary | ICD-10-CM

## 2015-07-19 MED ORDER — CYANOCOBALAMIN 1000 MCG/ML IJ SOLN
INTRAMUSCULAR | Status: AC
  Filled 2015-07-19: qty 1

## 2015-07-19 MED ORDER — CYANOCOBALAMIN 1000 MCG/ML IJ SOLN
1000.0000 ug | Freq: Once | INTRAMUSCULAR | Status: AC
  Administered 2015-07-19: 1000 ug via INTRAMUSCULAR

## 2015-07-19 NOTE — Patient Instructions (Signed)

## 2015-08-02 ENCOUNTER — Other Ambulatory Visit: Payer: BLUE CROSS/BLUE SHIELD

## 2015-08-02 ENCOUNTER — Ambulatory Visit (HOSPITAL_BASED_OUTPATIENT_CLINIC_OR_DEPARTMENT_OTHER): Payer: BLUE CROSS/BLUE SHIELD

## 2015-08-02 VITALS — BP 114/66 | HR 78 | Temp 98.0°F | Resp 16

## 2015-08-02 DIAGNOSIS — D509 Iron deficiency anemia, unspecified: Secondary | ICD-10-CM

## 2015-08-02 DIAGNOSIS — D510 Vitamin B12 deficiency anemia due to intrinsic factor deficiency: Secondary | ICD-10-CM | POA: Diagnosis not present

## 2015-08-02 MED ORDER — CYANOCOBALAMIN 1000 MCG/ML IJ SOLN
1000.0000 ug | Freq: Once | INTRAMUSCULAR | Status: AC
  Administered 2015-08-02: 1000 ug via INTRAMUSCULAR

## 2015-08-02 MED ORDER — CYANOCOBALAMIN 1000 MCG/ML IJ SOLN
INTRAMUSCULAR | Status: AC
  Filled 2015-08-02: qty 1

## 2015-08-02 NOTE — Patient Instructions (Signed)

## 2015-08-13 ENCOUNTER — Other Ambulatory Visit: Payer: Self-pay | Admitting: Hematology & Oncology

## 2015-08-16 ENCOUNTER — Ambulatory Visit (HOSPITAL_BASED_OUTPATIENT_CLINIC_OR_DEPARTMENT_OTHER): Payer: BLUE CROSS/BLUE SHIELD

## 2015-08-16 ENCOUNTER — Other Ambulatory Visit: Payer: BLUE CROSS/BLUE SHIELD

## 2015-08-16 VITALS — BP 124/63 | HR 66 | Temp 98.0°F | Resp 16

## 2015-08-16 DIAGNOSIS — D510 Vitamin B12 deficiency anemia due to intrinsic factor deficiency: Secondary | ICD-10-CM | POA: Diagnosis not present

## 2015-08-16 DIAGNOSIS — D509 Iron deficiency anemia, unspecified: Secondary | ICD-10-CM

## 2015-08-16 MED ORDER — CYANOCOBALAMIN 1000 MCG/ML IJ SOLN
1000.0000 ug | Freq: Once | INTRAMUSCULAR | Status: AC
  Administered 2015-08-16: 1000 ug via INTRAMUSCULAR

## 2015-08-16 MED ORDER — CYANOCOBALAMIN 1000 MCG/ML IJ SOLN
INTRAMUSCULAR | Status: AC
  Filled 2015-08-16: qty 1

## 2015-08-16 NOTE — Patient Instructions (Signed)

## 2015-08-30 ENCOUNTER — Other Ambulatory Visit: Payer: Self-pay | Admitting: *Deleted

## 2015-08-30 ENCOUNTER — Other Ambulatory Visit (HOSPITAL_BASED_OUTPATIENT_CLINIC_OR_DEPARTMENT_OTHER): Payer: BLUE CROSS/BLUE SHIELD

## 2015-08-30 ENCOUNTER — Ambulatory Visit (HOSPITAL_BASED_OUTPATIENT_CLINIC_OR_DEPARTMENT_OTHER): Payer: BLUE CROSS/BLUE SHIELD | Admitting: Family

## 2015-08-30 ENCOUNTER — Ambulatory Visit (HOSPITAL_BASED_OUTPATIENT_CLINIC_OR_DEPARTMENT_OTHER): Payer: BLUE CROSS/BLUE SHIELD

## 2015-08-30 ENCOUNTER — Encounter: Payer: Self-pay | Admitting: Family

## 2015-08-30 VITALS — BP 125/72 | HR 80 | Temp 98.3°F | Resp 16 | Ht 66.0 in | Wt 171.0 lb

## 2015-08-30 DIAGNOSIS — D510 Vitamin B12 deficiency anemia due to intrinsic factor deficiency: Secondary | ICD-10-CM | POA: Diagnosis not present

## 2015-08-30 DIAGNOSIS — K909 Intestinal malabsorption, unspecified: Secondary | ICD-10-CM | POA: Diagnosis not present

## 2015-08-30 DIAGNOSIS — D509 Iron deficiency anemia, unspecified: Secondary | ICD-10-CM

## 2015-08-30 DIAGNOSIS — Z85820 Personal history of malignant melanoma of skin: Secondary | ICD-10-CM

## 2015-08-30 DIAGNOSIS — K2940 Chronic atrophic gastritis without bleeding: Secondary | ICD-10-CM

## 2015-08-30 DIAGNOSIS — E538 Deficiency of other specified B group vitamins: Secondary | ICD-10-CM

## 2015-08-30 LAB — CBC WITH DIFFERENTIAL (CANCER CENTER ONLY)
BASO#: 0 10*3/uL (ref 0.0–0.2)
BASO%: 0.5 % (ref 0.0–2.0)
EOS%: 6.9 % (ref 0.0–7.0)
Eosinophils Absolute: 0.4 10*3/uL (ref 0.0–0.5)
HEMATOCRIT: 44.8 % (ref 34.8–46.6)
HGB: 15.4 g/dL (ref 11.6–15.9)
LYMPH#: 1.7 10*3/uL (ref 0.9–3.3)
LYMPH%: 29.8 % (ref 14.0–48.0)
MCH: 29.7 pg (ref 26.0–34.0)
MCHC: 34.4 g/dL (ref 32.0–36.0)
MCV: 87 fL (ref 81–101)
MONO#: 0.4 10*3/uL (ref 0.1–0.9)
MONO%: 6.7 % (ref 0.0–13.0)
NEUT%: 56.1 % (ref 39.6–80.0)
NEUTROS ABS: 3.2 10*3/uL (ref 1.5–6.5)
Platelets: 231 10*3/uL (ref 145–400)
RBC: 5.18 10*6/uL (ref 3.70–5.32)
RDW: 13.2 % (ref 11.1–15.7)
WBC: 5.7 10*3/uL (ref 3.9–10.0)

## 2015-08-30 MED ORDER — CYANOCOBALAMIN 1000 MCG/ML IJ SOLN
INTRAMUSCULAR | Status: AC
  Filled 2015-08-30: qty 1

## 2015-08-30 MED ORDER — FOLIC ACID 1 MG PO TABS: 2.0000 mg | ORAL_TABLET | Freq: Every day | ORAL | Status: DC

## 2015-08-30 MED ORDER — CYANOCOBALAMIN 1000 MCG/ML IJ SOLN
1000.0000 ug | Freq: Once | INTRAMUSCULAR | Status: AC
  Administered 2015-08-30: 1000 ug via INTRAMUSCULAR

## 2015-08-30 NOTE — Progress Notes (Signed)
Hematology and Oncology Follow Up Visit  Katherine Bradford [id number] 04-12-1961 55 y.o. 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 She is doing well but does have occasional fatigue. Her iron studies have been stable and she has not required an infusion for over a year. At her last visit her B 12 was still low so her injections were increased to twice monthly.   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 This is unchanged. No swelling or tenderness in her extremities.  No lymphadenopathy found on exam.  She is followed closely by Dermatology and has not had any suspicious areas removed in a while. She has not noticed any changes with her skin.  No fever, chills, n/v, cough, rash, dizziness, SOB, chest pain, palpitations, abdominal pain or changes in her bowel or bladder habits.  She has maintained a good appetite and is staying well hydrated. Her weight is stable.   Medications:    Medication List       This list is accurate as of: 08/30/15  8:40 AM.  Always use your most recent med list.               calcium carbonate 500 MG chewable tablet  Commonly known as:  TUMS - dosed in mg elemental calcium  Chew 1 tablet by mouth daily.     calcium carbonate 600 MG Tabs tablet  Commonly known as:  OS-CAL  Take 1 tablet by mouth daily.     cholecalciferol 1000 units tablet  Commonly known as:  VITAMIN D  Take 2,000 Units by mouth daily.     cyanocobalamin 1000 MCG/ML injection  Commonly known as:  (VITAMIN B-12)  Inject 1,000 mcg into the muscle every 30 (thirty) days.     folic acid 1 MG tablet  Commonly known as:  FOLVITE  Take 2  tablets (2 mg total) by mouth daily.     levothyroxine 50 MCG tablet  Commonly known as:  SYNTHROID, LEVOTHROID  Take 75 mcg by mouth daily before breakfast.     magnesium 30 MG tablet  Take 30 mg by mouth 1 day or 1 dose.     Vitamin C 500 MG Caps  Take by mouth every morning.     vitamin E 100 UNIT capsule  Take 100 Units by mouth daily.        Allergies:  Allergies  Allergen Reactions  . Vancomycin     Red neck syndrome from IV Vanco.  . Other     narcotics  . Prednisone Other (See Comments)    Patient prefers not to take due to the edginess it gives her  . Epinephrine Palpitations  . Keflex [Cephalexin] Rash    Past Medical History, Surgical history, Social history, and Family History were reviewed and updated.  Review of Systems: All other 10 point review of systems is negative.   Physical Exam:  height is 5\' 6"  (1.676 m) and weight is 171 lb (77.565 kg). Her oral temperature is 98.3 F (36.8 C). Her blood pressure is 125/72 and her pulse is 80. Her respiration is 16.   Wt Readings from Last 3 Encounters:  08/30/15  171 lb (77.565 kg)  05/03/15 169 lb (76.658 kg)  02/08/15 166 lb (75.297 kg)    Ocular: Sclerae unicteric, pupils equal, round and reactive to light Ear-nose-throat: Oropharynx clear, dentition fair Lymphatic: No cervical supraclavicular or axillary adenopathy Lungs no rales or rhonchi, good excursion bilaterally Heart regular rate and rhythm, no murmur appreciated Abd soft, nontender, positive bowel sounds, no liver or spleen tip palpated on exam, no fluid wave MSK no focal spinal tenderness, no joint edema Neuro: non-focal, well-oriented, appropriate affect Breasts: Deferred  Lab Results  Component Value Date   WBC 5.7 08/30/2015   HGB 15.4 08/30/2015   HCT 44.8 08/30/2015   MCV 87 08/30/2015   PLT 231 08/30/2015   Lab Results  Component Value Date   FERRITIN 103 07/05/2015   IRON 91 07/05/2015   TIBC 299 07/05/2015   UIBC 208  07/05/2015   IRONPCTSAT 30 07/05/2015   Lab Results  Component Value Date   RETICCTPCT 1.4 02/08/2015   RBC 5.18 08/30/2015   RETICCTABS 72.5 02/08/2015   No results found for: [facility] Lab Results  Component Value Date   IGGSERUM 1200 10/05/2012   IGA 251 10/17/2012   IGA 267 10/17/2012   IGMSERUM 90 10/05/2012   No results found for: Kathrynn Ducking, MSPIKE, SPEI   Chemistry      Component Value Date/Time   NA 139 05/03/2015 0827   NA 142 11/09/2014 0859   NA 141 09/21/2012 0920   K 4.1 05/03/2015 0827   K 3.7 11/09/2014 0859   K 4.3 09/21/2012 0920   CL 105 11/09/2014 0859   CL 104 09/21/2012 0920   CO2 26 05/03/2015 0827   CO2 25 11/09/2014 0859   CO2 25 09/21/2012 0920   BUN 12.7 05/03/2015 0827   BUN 13 11/09/2014 0859   BUN 12 09/21/2012 0920   CREATININE 1.0 05/03/2015 0827   CREATININE 1.1 11/09/2014 0859   CREATININE 0.78 09/21/2012 0920      Component Value Date/Time   CALCIUM 9.4 05/03/2015 0827   CALCIUM 9.0 11/09/2014 0859   CALCIUM 9.2 09/21/2012 0920   ALKPHOS 93 05/03/2015 0827   ALKPHOS 94* 11/09/2014 0859   ALKPHOS 79 09/21/2012 0920   AST 17 05/03/2015 0827   AST 19 11/09/2014 0859   AST 16 09/21/2012 0920   ALT 17 05/03/2015 0827   ALT 24 11/09/2014 0859   ALT 15 09/21/2012 0920   BILITOT 0.46 05/03/2015 0827   BILITOT 0.70 11/09/2014 0859   BILITOT 0.5 09/21/2012 0920     Impression and Plan: Katherine Bradford is a pleasant 55 yo white female with multifactorial anemia and positive intrinsic factor antibodies. She is doing well with some occasional fatigue at times.  She will continue her B 12 injections twice a month. Level today is pending.  She also has a history of stage 1A melanoma of the right inner thigh. She is followed closely by dermatology and so far there has been no evidence of recurrence.  We will see what her iron studies show and bring her in next week for  an infusion if needed.  We will plan to see her back in 4 months for labs and follow-up.  She will contact us with any questions or concerns. We can certainly see her sooner if need be.   Eliezer Bottom, NP 5/19/20178:40 AM

## 2015-08-30 NOTE — Patient Instructions (Signed)

## 2015-08-31 LAB — IRON AND TIBC CHCC
IRON: 78 ug/dL (ref 27–159)
Iron Saturation: 27 % (ref 15–55)
Total Iron Binding Capacity: 286 ug/dL (ref 250–450)
UIBC: 208 ug/dL (ref 131–425)

## 2015-08-31 LAB — VITAMIN B12: VITAMIN B 12: 277 pg/mL (ref 211–946)

## 2015-08-31 LAB — FERRITIN CHCC: FERRITIN: 122 ng/mL (ref 15–150)

## 2015-08-31 LAB — RETICULOCYTES: RETICULOCYTE COUNT: 1.4 % (ref 0.6–2.6)

## 2015-09-13 ENCOUNTER — Other Ambulatory Visit: Payer: BLUE CROSS/BLUE SHIELD

## 2015-09-13 ENCOUNTER — Ambulatory Visit (HOSPITAL_BASED_OUTPATIENT_CLINIC_OR_DEPARTMENT_OTHER): Payer: BLUE CROSS/BLUE SHIELD

## 2015-09-13 VITALS — BP 128/53 | HR 63 | Temp 98.0°F | Resp 18

## 2015-09-13 DIAGNOSIS — D509 Iron deficiency anemia, unspecified: Secondary | ICD-10-CM

## 2015-09-13 DIAGNOSIS — D510 Vitamin B12 deficiency anemia due to intrinsic factor deficiency: Secondary | ICD-10-CM

## 2015-09-13 MED ORDER — CYANOCOBALAMIN 1000 MCG/ML IJ SOLN
1000.0000 ug | Freq: Once | INTRAMUSCULAR | Status: AC
  Administered 2015-09-13: 1000 ug via INTRAMUSCULAR

## 2015-09-13 MED ORDER — CYANOCOBALAMIN 1000 MCG/ML IJ SOLN
INTRAMUSCULAR | Status: AC
Start: 2015-09-13 — End: 2015-09-13
  Filled 2015-09-13: qty 1

## 2015-09-13 NOTE — Patient Instructions (Signed)

## 2015-09-27 ENCOUNTER — Ambulatory Visit (HOSPITAL_BASED_OUTPATIENT_CLINIC_OR_DEPARTMENT_OTHER): Payer: BLUE CROSS/BLUE SHIELD

## 2015-09-27 ENCOUNTER — Other Ambulatory Visit: Payer: BLUE CROSS/BLUE SHIELD

## 2015-09-27 VITALS — BP 117/66 | HR 77 | Temp 98.3°F | Resp 18

## 2015-09-27 DIAGNOSIS — D509 Iron deficiency anemia, unspecified: Secondary | ICD-10-CM

## 2015-09-27 DIAGNOSIS — D510 Vitamin B12 deficiency anemia due to intrinsic factor deficiency: Secondary | ICD-10-CM | POA: Diagnosis not present

## 2015-09-27 MED ORDER — CYANOCOBALAMIN 1000 MCG/ML IJ SOLN
1000.0000 ug | Freq: Once | INTRAMUSCULAR | Status: AC
  Administered 2015-09-27: 1000 ug via INTRAMUSCULAR

## 2015-09-27 MED ORDER — CYANOCOBALAMIN 1000 MCG/ML IJ SOLN
INTRAMUSCULAR | Status: AC
  Filled 2015-09-27: qty 1

## 2015-09-27 NOTE — Patient Instructions (Signed)

## 2015-10-10 ENCOUNTER — Ambulatory Visit (HOSPITAL_BASED_OUTPATIENT_CLINIC_OR_DEPARTMENT_OTHER): Payer: BLUE CROSS/BLUE SHIELD

## 2015-10-10 VITALS — BP 131/67 | HR 74 | Temp 98.3°F | Resp 18

## 2015-10-10 DIAGNOSIS — D510 Vitamin B12 deficiency anemia due to intrinsic factor deficiency: Secondary | ICD-10-CM | POA: Diagnosis not present

## 2015-10-10 DIAGNOSIS — D509 Iron deficiency anemia, unspecified: Secondary | ICD-10-CM

## 2015-10-10 MED ORDER — CYANOCOBALAMIN 1000 MCG/ML IJ SOLN
1000.0000 ug | Freq: Once | INTRAMUSCULAR | Status: AC
  Administered 2015-10-10: 1000 ug via INTRAMUSCULAR

## 2015-10-10 NOTE — Patient Instructions (Signed)

## 2015-10-11 ENCOUNTER — Ambulatory Visit: Payer: BLUE CROSS/BLUE SHIELD

## 2015-10-11 ENCOUNTER — Other Ambulatory Visit: Payer: BLUE CROSS/BLUE SHIELD

## 2015-10-14 ENCOUNTER — Ambulatory Visit: Payer: BLUE CROSS/BLUE SHIELD

## 2015-10-25 ENCOUNTER — Other Ambulatory Visit: Payer: BLUE CROSS/BLUE SHIELD

## 2015-10-25 ENCOUNTER — Ambulatory Visit (HOSPITAL_BASED_OUTPATIENT_CLINIC_OR_DEPARTMENT_OTHER): Payer: BLUE CROSS/BLUE SHIELD

## 2015-10-25 VITALS — BP 123/72 | HR 75 | Temp 98.1°F | Resp 18

## 2015-10-25 DIAGNOSIS — E538 Deficiency of other specified B group vitamins: Secondary | ICD-10-CM

## 2015-10-25 DIAGNOSIS — D509 Iron deficiency anemia, unspecified: Secondary | ICD-10-CM

## 2015-10-25 MED ORDER — HEPARIN SOD (PORK) LOCK FLUSH 100 UNIT/ML IV SOLN
250.0000 [IU] | Freq: Once | INTRAVENOUS | Status: DC | PRN
  Filled 2015-10-25: qty 5

## 2015-10-25 MED ORDER — CYANOCOBALAMIN 1000 MCG/ML IJ SOLN
INTRAMUSCULAR | Status: AC
  Filled 2015-10-25: qty 1

## 2015-10-25 MED ORDER — SODIUM CHLORIDE 0.9 % IJ SOLN
10.0000 mL | INTRAMUSCULAR | Status: DC | PRN
  Filled 2015-10-25: qty 10

## 2015-10-25 MED ORDER — CYANOCOBALAMIN 1000 MCG/ML IJ SOLN
1000.0000 ug | Freq: Once | INTRAMUSCULAR | Status: AC
  Administered 2015-10-25: 1000 ug via INTRAMUSCULAR

## 2015-10-25 MED ORDER — HEPARIN SOD (PORK) LOCK FLUSH 100 UNIT/ML IV SOLN
500.0000 [IU] | Freq: Once | INTRAVENOUS | Status: DC | PRN
  Filled 2015-10-25: qty 5

## 2015-10-25 MED ORDER — SODIUM CHLORIDE 0.9 % IJ SOLN
3.0000 mL | Freq: Once | INTRAMUSCULAR | Status: DC | PRN
  Filled 2015-10-25: qty 10

## 2015-10-25 NOTE — Patient Instructions (Signed)

## 2015-10-28 ENCOUNTER — Other Ambulatory Visit: Payer: Self-pay | Admitting: Family

## 2015-10-28 DIAGNOSIS — E538 Deficiency of other specified B group vitamins: Secondary | ICD-10-CM

## 2015-11-08 ENCOUNTER — Other Ambulatory Visit: Payer: BLUE CROSS/BLUE SHIELD

## 2015-11-08 ENCOUNTER — Ambulatory Visit (HOSPITAL_BASED_OUTPATIENT_CLINIC_OR_DEPARTMENT_OTHER): Payer: BLUE CROSS/BLUE SHIELD

## 2015-11-08 VITALS — BP 102/86 | HR 70 | Temp 98.2°F | Resp 16

## 2015-11-08 DIAGNOSIS — D509 Iron deficiency anemia, unspecified: Secondary | ICD-10-CM

## 2015-11-08 DIAGNOSIS — E538 Deficiency of other specified B group vitamins: Secondary | ICD-10-CM

## 2015-11-08 MED ORDER — CYANOCOBALAMIN 1000 MCG/ML IJ SOLN
1000.0000 ug | Freq: Once | INTRAMUSCULAR | Status: AC
  Administered 2015-11-08: 1000 ug via INTRAMUSCULAR

## 2015-11-08 MED ORDER — CYANOCOBALAMIN 1000 MCG/ML IJ SOLN
INTRAMUSCULAR | Status: AC
  Filled 2015-11-08: qty 1

## 2015-11-08 NOTE — Patient Instructions (Signed)

## 2015-11-22 ENCOUNTER — Ambulatory Visit (HOSPITAL_BASED_OUTPATIENT_CLINIC_OR_DEPARTMENT_OTHER): Payer: BLUE CROSS/BLUE SHIELD

## 2015-11-22 VITALS — BP 108/68 | HR 70 | Temp 98.3°F | Resp 20

## 2015-11-22 DIAGNOSIS — D509 Iron deficiency anemia, unspecified: Secondary | ICD-10-CM

## 2015-11-22 DIAGNOSIS — E538 Deficiency of other specified B group vitamins: Secondary | ICD-10-CM | POA: Diagnosis not present

## 2015-11-22 MED ORDER — CYANOCOBALAMIN 1000 MCG/ML IJ SOLN
INTRAMUSCULAR | Status: AC
  Filled 2015-11-22: qty 1

## 2015-11-22 MED ORDER — CYANOCOBALAMIN 1000 MCG/ML IJ SOLN
1000.0000 ug | Freq: Once | INTRAMUSCULAR | Status: AC
  Administered 2015-11-22: 1000 ug via INTRAMUSCULAR

## 2015-11-22 NOTE — Patient Instructions (Signed)

## 2015-12-06 ENCOUNTER — Ambulatory Visit: Payer: BLUE CROSS/BLUE SHIELD

## 2015-12-09 DIAGNOSIS — H5202 Hypermetropia, left eye: Secondary | ICD-10-CM | POA: Diagnosis not present

## 2015-12-09 DIAGNOSIS — H5201 Hypermetropia, right eye: Secondary | ICD-10-CM | POA: Diagnosis not present

## 2015-12-20 ENCOUNTER — Ambulatory Visit (HOSPITAL_BASED_OUTPATIENT_CLINIC_OR_DEPARTMENT_OTHER): Payer: BLUE CROSS/BLUE SHIELD

## 2015-12-20 VITALS — BP 116/73 | HR 71 | Temp 98.3°F | Resp 18

## 2015-12-20 DIAGNOSIS — D509 Iron deficiency anemia, unspecified: Secondary | ICD-10-CM

## 2015-12-20 DIAGNOSIS — D510 Vitamin B12 deficiency anemia due to intrinsic factor deficiency: Secondary | ICD-10-CM

## 2015-12-20 MED ORDER — CYANOCOBALAMIN 1000 MCG/ML IJ SOLN
1000.0000 ug | Freq: Once | INTRAMUSCULAR | Status: AC
  Administered 2015-12-20: 1000 ug via INTRAMUSCULAR

## 2015-12-20 MED ORDER — CYANOCOBALAMIN 1000 MCG/ML IJ SOLN
INTRAMUSCULAR | Status: AC
  Filled 2015-12-20: qty 1

## 2015-12-20 NOTE — Patient Instructions (Signed)

## 2015-12-24 DIAGNOSIS — E039 Hypothyroidism, unspecified: Secondary | ICD-10-CM | POA: Diagnosis not present

## 2016-01-03 ENCOUNTER — Ambulatory Visit (HOSPITAL_BASED_OUTPATIENT_CLINIC_OR_DEPARTMENT_OTHER): Payer: BLUE CROSS/BLUE SHIELD | Admitting: Hematology & Oncology

## 2016-01-03 ENCOUNTER — Telehealth: Payer: Self-pay | Admitting: *Deleted

## 2016-01-03 ENCOUNTER — Encounter: Payer: Self-pay | Admitting: Hematology & Oncology

## 2016-01-03 ENCOUNTER — Ambulatory Visit (HOSPITAL_BASED_OUTPATIENT_CLINIC_OR_DEPARTMENT_OTHER): Payer: BLUE CROSS/BLUE SHIELD

## 2016-01-03 ENCOUNTER — Other Ambulatory Visit (HOSPITAL_BASED_OUTPATIENT_CLINIC_OR_DEPARTMENT_OTHER): Payer: BLUE CROSS/BLUE SHIELD

## 2016-01-03 VITALS — BP 133/73 | HR 71 | Temp 98.0°F | Resp 16 | Ht 66.0 in | Wt 169.1 lb

## 2016-01-03 DIAGNOSIS — E538 Deficiency of other specified B group vitamins: Secondary | ICD-10-CM | POA: Diagnosis not present

## 2016-01-03 DIAGNOSIS — D510 Vitamin B12 deficiency anemia due to intrinsic factor deficiency: Secondary | ICD-10-CM

## 2016-01-03 DIAGNOSIS — D509 Iron deficiency anemia, unspecified: Secondary | ICD-10-CM

## 2016-01-03 DIAGNOSIS — K2940 Chronic atrophic gastritis without bleeding: Secondary | ICD-10-CM

## 2016-01-03 DIAGNOSIS — K909 Intestinal malabsorption, unspecified: Secondary | ICD-10-CM

## 2016-01-03 DIAGNOSIS — Z85820 Personal history of malignant melanoma of skin: Secondary | ICD-10-CM

## 2016-01-03 LAB — CBC WITH DIFFERENTIAL (CANCER CENTER ONLY)
BASO#: 0 10*3/uL (ref 0.0–0.2)
BASO%: 0.7 % (ref 0.0–2.0)
EOS ABS: 0.3 10*3/uL (ref 0.0–0.5)
EOS%: 6.3 % (ref 0.0–7.0)
HCT: 42.4 % (ref 34.8–46.6)
HGB: 14.5 g/dL (ref 11.6–15.9)
LYMPH#: 1.6 10*3/uL (ref 0.9–3.3)
LYMPH%: 37.2 % (ref 14.0–48.0)
MCH: 29.4 pg (ref 26.0–34.0)
MCHC: 34.2 g/dL (ref 32.0–36.0)
MCV: 86 fL (ref 81–101)
MONO#: 0.4 10*3/uL (ref 0.1–0.9)
MONO%: 8.8 % (ref 0.0–13.0)
NEUT#: 2.1 10*3/uL (ref 1.5–6.5)
NEUT%: 47 % (ref 39.6–80.0)
PLATELETS: 250 10*3/uL (ref 145–400)
RBC: 4.93 10*6/uL (ref 3.70–5.32)
RDW: 13.2 % (ref 11.1–15.7)
WBC: 4.4 10*3/uL (ref 3.9–10.0)

## 2016-01-03 LAB — IRON AND TIBC
%SAT: 23 % (ref 21–57)
Iron: 66 ug/dL (ref 41–142)
TIBC: 280 ug/dL (ref 236–444)
UIBC: 215 ug/dL (ref 120–384)

## 2016-01-03 LAB — FERRITIN: FERRITIN: 64 ng/mL (ref 9–269)

## 2016-01-03 MED ORDER — CYANOCOBALAMIN 1000 MCG/ML IJ SOLN
1000.0000 ug | Freq: Once | INTRAMUSCULAR | Status: AC
  Administered 2016-01-03: 1000 ug via INTRAMUSCULAR

## 2016-01-03 MED ORDER — CYANOCOBALAMIN 1000 MCG/ML IJ SOLN
INTRAMUSCULAR | Status: AC
  Filled 2016-01-03: qty 1

## 2016-01-03 NOTE — Progress Notes (Signed)
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 She is doing fairly well. She got back from the beach recently. She and her family had a very good time at the beach. She is very liberal with using sunscreen.  The vitamin B 12 has been doing good for her. We have been doing this every 2 weeks.  Her iron studies have also been holding on nicely. Her last ferritin was 122 with iron saturation of 27%. This was back in May.  She still has a neuropathy in her hands. 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 She has had neck surgery. 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  Her last vitamin B-12 level back in May was 277. As such, I think every 2 week B-12 is adequate.  She has a case of poison oak. She gets this from her husband. He, amazingly, does not get dermatitis from poison oak.  She's had no change in bowel or bladder habits.   She is trying to lose a little weight. She is still working in a Soil scientist. She is quite busy.  Overall, her performance status is ECOG 0.    CURRENT TREATMENT: Vitamin B12 1 mg IM monthly IV iron as indicated  REVIEW OF SYSTEMS: All other 10 point review of systems is negative.   PAST MEDICAL HISTORY: Past Medical History:  Diagnosis Date  . Anemia   . B12 deficiency   . Graves disease   . Iron deficiency anemia, unspecified 12/27/2012  . Numbness and tingling in hands   . Weakness     PAST SURGICAL HISTORY: Past  Surgical History:  Procedure Laterality Date  . None      FAMILY HISTORY Family History  Problem Relation Age of Onset  . High blood pressure Mother   . Osteoarthritis Mother   . Thyroid disease Mother   . Heart failure Father   . Heart disease Father   . Diabetes type II Brother   . Colon cancer Neg Hx     GYNECOLOGIC HISTORY:  No LMP recorded.   SOCIAL HISTORY: Social History   Social History  . Marital status: Married    Spouse name: Herbie [place]  . Number of children: 2  . Years of education: 13   Occupational History  . Dental Assistant      Employed at [employer]  .  Dr. Lucien Mons   Social History Main Topics  . Smoking status: Never Smoker  . Smokeless tobacco: Never Used     Comment: Never used tobacco  . Alcohol use No  . Drug use: No  . Sexual activity: Not on file   Other Topics Concern  . Not on file   Social History Narrative   Patient lives at home at husband Herbie [place]). Patient works full time. Patient has college education.   Right handed.   Caffeine- one cup daily.    ADVANCED DIRECTIVES:  <no information>  HEALTH MAINTENANCE: Social History  Substance Use Topics  . Smoking status: Never Smoker  . Smokeless tobacco: Never Used     Comment:  Never used tobacco  . Alcohol use No   Colonoscopy: PAP: Bone density: Lipid panel:  Allergies  Allergen Reactions  . Vancomycin     Red neck syndrome from IV Vanco.  . Other     narcotics  . Prednisone Other (See Comments)    Patient prefers not to take due to the edginess it gives her  . Epinephrine Palpitations  . Keflex [Cephalexin] Rash    Current Outpatient Prescriptions  Medication Sig Dispense Refill  . Ascorbic Acid (VITAMIN C) 500 MG CAPS Take by mouth every morning.    . calcium carbonate (OS-CAL) 600 MG TABS tablet Take 1 tablet by mouth daily.    . calcium carbonate (TUMS - DOSED IN MG ELEMENTAL CALCIUM) 500 MG chewable tablet Chew 1 tablet by mouth daily.    .  cholecalciferol (VITAMIN D) 1000 UNITS tablet Take 2,000 Units by mouth daily.    . cyanocobalamin (,VITAMIN B-12,) 1000 MCG/ML injection Inject 1,000 mcg into the muscle every 30 (thirty) days.    . folic acid (FOLVITE) 1 MG tablet Take 2 tablets (2 mg total) by mouth daily. 180 tablet 3  . levothyroxine (SYNTHROID) 75 MCG tablet Take by mouth.    . magnesium 30 MG tablet Take 30 mg by mouth 1 day or 1 dose.    . vitamin E 100 UNIT capsule Take 100 Units by mouth daily.      No current facility-administered medications for this visit.     OBJECTIVE: Vitals:   01/03/16 0840  BP: 133/73  Pulse: 71  Resp: 16  Temp: 98 F (36.7 C)    Filed Weights   01/03/16 0840  Weight: 169 lb 1.9 oz (76.7 kg)   LAB RESULTS: CMP     Component Value Date/Time   NA 139 05/03/2015 0827   K 4.1 05/03/2015 0827   CL 105 11/09/2014 0859   CO2 26 05/03/2015 0827   GLUCOSE 102 05/03/2015 0827   GLUCOSE 120 (H) 11/09/2014 0859   BUN 12.7 05/03/2015 0827   CREATININE 1.0 05/03/2015 0827   CALCIUM 9.4 05/03/2015 0827   PROT 7.3 05/03/2015 0827   ALBUMIN 4.0 05/03/2015 0827   AST 17 05/03/2015 0827   ALT 17 05/03/2015 0827   ALKPHOS 93 05/03/2015 0827   BILITOT 0.46 05/03/2015 0827   GFRNONAA 88 09/21/2012 0920   GFRAA 102 09/21/2012 0920   INo results found for: SPEP, UPEP Lab Results  Component Value Date   WBC 4.4 01/03/2016   NEUTROABS 2.1 01/03/2016   HGB 14.5 01/03/2016   HCT 42.4 01/03/2016   MCV 86 01/03/2016   PLT 250 01/03/2016   No results found for: LABCA2 No components found for: [id number] No results for input(s): INR in the last 168 hours. Urinalysis No results found for: COLORURINE, APPEARANCEUR, LABSPEC, PHURINE, GLUCOSEU, HGBUR, BILIRUBINUR, KETONESUR, PROTEINUR, UROBILINOGEN, NITRITE, LEUKOCYTESUR STUDIES: No results found.  ASSESSMENT/PLAN: Ms. Senff is 55 year old female with pernicious anemia. She has positive intrinsic factor antibodies.We will go ahead and give  her B-12 today. Again, I think that the twice a month dosing is perfect for her.  I would be surprised if she needed IV iron. We'll see what her iron studies show.  Of note, she did have an increase in her Synthroid. She now states taking 75 g.  I will plan to see her back myself in about 3 months.   Volanda Napoleon, MD 01/03/2016 9:13 AM

## 2016-01-03 NOTE — Telephone Encounter (Signed)
-----   Message from Eliezer Bottom, NP sent at 01/03/2016  3:27 PM EDT ----- Regarding: Iron  Iron studies look good. No infusion needed at this time. Thank you!!!.   Katherine Bradford   ----- Message ----- From: Interface, Lab In Three Zero One Sent: 01/03/2016   8:31 AM To: Eliezer Bottom, NP

## 2016-01-03 NOTE — Patient Instructions (Signed)

## 2016-01-04 LAB — RETICULOCYTES: Reticulocyte Count: 1.4 % (ref 0.6–2.6)

## 2016-01-04 LAB — VITAMIN B12: Vitamin B12: 396 pg/mL (ref 211–946)

## 2016-01-08 DIAGNOSIS — Z86018 Personal history of other benign neoplasm: Secondary | ICD-10-CM | POA: Diagnosis not present

## 2016-01-08 DIAGNOSIS — Z85820 Personal history of malignant melanoma of skin: Secondary | ICD-10-CM | POA: Diagnosis not present

## 2016-01-08 DIAGNOSIS — L814 Other melanin hyperpigmentation: Secondary | ICD-10-CM | POA: Diagnosis not present

## 2016-01-08 DIAGNOSIS — D1800 Hemangioma unspecified site: Secondary | ICD-10-CM | POA: Diagnosis not present

## 2016-01-17 ENCOUNTER — Ambulatory Visit: Payer: BLUE CROSS/BLUE SHIELD

## 2016-01-20 ENCOUNTER — Ambulatory Visit (HOSPITAL_BASED_OUTPATIENT_CLINIC_OR_DEPARTMENT_OTHER): Payer: BLUE CROSS/BLUE SHIELD

## 2016-01-20 VITALS — BP 130/77 | HR 76 | Temp 98.3°F

## 2016-01-20 DIAGNOSIS — D510 Vitamin B12 deficiency anemia due to intrinsic factor deficiency: Secondary | ICD-10-CM | POA: Diagnosis not present

## 2016-01-20 DIAGNOSIS — D509 Iron deficiency anemia, unspecified: Secondary | ICD-10-CM

## 2016-01-20 MED ORDER — CYANOCOBALAMIN 1000 MCG/ML IJ SOLN
INTRAMUSCULAR | Status: AC
  Filled 2016-01-20: qty 1

## 2016-01-20 MED ORDER — CYANOCOBALAMIN 1000 MCG/ML IJ SOLN
1000.0000 ug | Freq: Once | INTRAMUSCULAR | Status: AC
  Administered 2016-01-20: 1000 ug via INTRAMUSCULAR

## 2016-01-20 NOTE — Patient Instructions (Signed)

## 2016-01-31 ENCOUNTER — Ambulatory Visit (HOSPITAL_BASED_OUTPATIENT_CLINIC_OR_DEPARTMENT_OTHER): Payer: BLUE CROSS/BLUE SHIELD

## 2016-01-31 VITALS — BP 126/63 | HR 68 | Temp 98.0°F | Resp 18

## 2016-01-31 DIAGNOSIS — D509 Iron deficiency anemia, unspecified: Secondary | ICD-10-CM

## 2016-01-31 DIAGNOSIS — D510 Vitamin B12 deficiency anemia due to intrinsic factor deficiency: Secondary | ICD-10-CM | POA: Diagnosis not present

## 2016-01-31 MED ORDER — CYANOCOBALAMIN 1000 MCG/ML IJ SOLN
1000.0000 ug | Freq: Once | INTRAMUSCULAR | Status: AC
  Administered 2016-01-31: 1000 ug via INTRAMUSCULAR

## 2016-01-31 MED ORDER — CYANOCOBALAMIN 1000 MCG/ML IJ SOLN
INTRAMUSCULAR | Status: AC
  Filled 2016-01-31: qty 1

## 2016-01-31 NOTE — Patient Instructions (Signed)

## 2016-02-14 ENCOUNTER — Ambulatory Visit: Payer: BLUE CROSS/BLUE SHIELD

## 2016-02-14 VITALS — BP 127/60 | HR 66 | Temp 97.8°F | Resp 18

## 2016-02-14 DIAGNOSIS — D509 Iron deficiency anemia, unspecified: Secondary | ICD-10-CM

## 2016-02-14 MED ORDER — CYANOCOBALAMIN 1000 MCG/ML IJ SOLN
INTRAMUSCULAR | Status: AC
  Filled 2016-02-14: qty 1

## 2016-02-14 MED ORDER — CYANOCOBALAMIN 1000 MCG/ML IJ SOLN: 1000.0000 ug | Freq: Once | INTRAMUSCULAR | Status: DC

## 2016-02-14 NOTE — Patient Instructions (Signed)

## 2016-02-28 ENCOUNTER — Ambulatory Visit: Payer: BLUE CROSS/BLUE SHIELD

## 2016-03-13 ENCOUNTER — Ambulatory Visit (HOSPITAL_BASED_OUTPATIENT_CLINIC_OR_DEPARTMENT_OTHER): Payer: BLUE CROSS/BLUE SHIELD

## 2016-03-13 VITALS — BP 132/67 | HR 79 | Temp 98.0°F | Resp 18

## 2016-03-13 DIAGNOSIS — D510 Vitamin B12 deficiency anemia due to intrinsic factor deficiency: Secondary | ICD-10-CM

## 2016-03-13 DIAGNOSIS — D509 Iron deficiency anemia, unspecified: Secondary | ICD-10-CM

## 2016-03-13 MED ORDER — CYANOCOBALAMIN 1000 MCG/ML IJ SOLN
INTRAMUSCULAR | Status: AC
  Filled 2016-03-13: qty 1

## 2016-03-13 MED ORDER — CYANOCOBALAMIN 1000 MCG/ML IJ SOLN
1000.0000 ug | Freq: Once | INTRAMUSCULAR | Status: AC
  Administered 2016-03-13: 1000 ug via INTRAMUSCULAR

## 2016-03-13 NOTE — Patient Instructions (Signed)
Cyanocobalamin, Vitamin B12 injection What is this medicine? CYANOCOBALAMIN (sye an oh koe BAL a min) is a man made form of vitamin B12. Vitamin B12 is used in the growth of healthy blood cells, nerve cells, and proteins in the body. It also helps with the metabolism of fats and carbohydrates. This medicine is used to treat people who can not absorb vitamin B12. This medicine may be used for other purposes; ask your health care provider or pharmacist if you have questions. COMMON BRAND NAME(S): B-12 Compliance Kit, B-12 Injection Kit, Cyomin, LA-12, Nutri-Twelve, Physicians EZ Use B-12, Primabalt What should I tell my health care provider before I take this medicine? They need to know if you have any of these conditions: -kidney disease -Leber's disease -megaloblastic anemia -an unusual or allergic reaction to cyanocobalamin, cobalt, other medicines, foods, dyes, or preservatives -pregnant or trying to get pregnant -breast-feeding How should I use this medicine? This medicine is injected into a muscle or deeply under the skin. It is usually given by a health care professional in a clinic or doctor's office. However, your doctor may teach you how to inject yourself. Follow all instructions. Talk to your pediatrician regarding the use of this medicine in children. Special care may be needed. Overdosage: If you think you have taken too much of this medicine contact a poison control center or emergency room at once. NOTE: This medicine is only for you. Do not share this medicine with others. What if I miss a dose? If you are given your dose at a clinic or doctor's office, call to reschedule your appointment. If you give your own injections and you miss a dose, take it as soon as you can. If it is almost time for your next dose, take only that dose. Do not take double or extra doses. What may interact with this medicine? -colchicine -heavy alcohol intake This list may not describe all possible  interactions. Give your health care provider a list of all the medicines, herbs, non-prescription drugs, or dietary supplements you use. Also tell them if you smoke, drink alcohol, or use illegal drugs. Some items may interact with your medicine. What should I watch for while using this medicine? Visit your doctor or health care professional regularly. You may need blood work done while you are taking this medicine. You may need to follow a special diet. Talk to your doctor. Limit your alcohol intake and avoid smoking to get the best benefit. What side effects may I notice from receiving this medicine? Side effects that you should report to your doctor or health care professional as soon as possible: -allergic reactions like skin rash, itching or hives, swelling of the face, lips, or tongue -blue tint to skin -chest tightness, pain -difficulty breathing, wheezing -dizziness -red, swollen painful area on the leg Side effects that usually do not require medical attention (report to your doctor or health care professional if they continue or are bothersome): -diarrhea -headache This list may not describe all possible side effects. Call your doctor for medical advice about side effects. You may report side effects to FDA at 1-800-FDA-1088. Where should I keep my medicine? Keep out of the reach of children. Store at room temperature between 15 and 30 degrees C (59 and 85 degrees F). Protect from light. Throw away any unused medicine after the expiration date. NOTE: This sheet is a summary. It may not cover all possible information. If you have questions about this medicine, talk to your doctor, pharmacist, or   health care provider.  2017 Elsevier/Gold Standard (2007-07-11 22:10:20)

## 2016-03-20 ENCOUNTER — Other Ambulatory Visit: Payer: Self-pay | Admitting: Obstetrics and Gynecology

## 2016-03-20 DIAGNOSIS — Z1231 Encounter for screening mammogram for malignant neoplasm of breast: Secondary | ICD-10-CM

## 2016-03-23 ENCOUNTER — Telehealth: Payer: Self-pay | Admitting: Hematology & Oncology

## 2016-03-23 NOTE — Telephone Encounter (Signed)
Patient called and cx 03/27/16 apt and resch for 03/25/16

## 2016-03-25 ENCOUNTER — Ambulatory Visit (HOSPITAL_BASED_OUTPATIENT_CLINIC_OR_DEPARTMENT_OTHER): Payer: BLUE CROSS/BLUE SHIELD

## 2016-03-25 VITALS — BP 119/67 | HR 91 | Temp 97.7°F | Resp 18

## 2016-03-25 DIAGNOSIS — D510 Vitamin B12 deficiency anemia due to intrinsic factor deficiency: Secondary | ICD-10-CM

## 2016-03-25 DIAGNOSIS — D509 Iron deficiency anemia, unspecified: Secondary | ICD-10-CM

## 2016-03-25 MED ORDER — CYANOCOBALAMIN 1000 MCG/ML IJ SOLN
INTRAMUSCULAR | Status: AC
  Filled 2016-03-25: qty 1

## 2016-03-25 MED ORDER — CYANOCOBALAMIN 1000 MCG/ML IJ SOLN
1000.0000 ug | Freq: Once | INTRAMUSCULAR | Status: AC
  Administered 2016-03-25: 1000 ug via INTRAMUSCULAR

## 2016-03-25 NOTE — Patient Instructions (Signed)
Cyanocobalamin, Vitamin B12 injection What is this medicine? CYANOCOBALAMIN (sye an oh koe BAL a min) is a man made form of vitamin B12. Vitamin B12 is used in the growth of healthy blood cells, nerve cells, and proteins in the body. It also helps with the metabolism of fats and carbohydrates. This medicine is used to treat people who can not absorb vitamin B12. This medicine may be used for other purposes; ask your health care provider or pharmacist if you have questions. COMMON BRAND NAME(S): B-12 Compliance Kit, B-12 Injection Kit, Cyomin, LA-12, Nutri-Twelve, Physicians EZ Use B-12, Primabalt What should I tell my health care provider before I take this medicine? They need to know if you have any of these conditions: -kidney disease -Leber's disease -megaloblastic anemia -an unusual or allergic reaction to cyanocobalamin, cobalt, other medicines, foods, dyes, or preservatives -pregnant or trying to get pregnant -breast-feeding How should I use this medicine? This medicine is injected into a muscle or deeply under the skin. It is usually given by a health care professional in a clinic or doctor's office. However, your doctor may teach you how to inject yourself. Follow all instructions. Talk to your pediatrician regarding the use of this medicine in children. Special care may be needed. Overdosage: If you think you have taken too much of this medicine contact a poison control center or emergency room at once. NOTE: This medicine is only for you. Do not share this medicine with others. What if I miss a dose? If you are given your dose at a clinic or doctor's office, call to reschedule your appointment. If you give your own injections and you miss a dose, take it as soon as you can. If it is almost time for your next dose, take only that dose. Do not take double or extra doses. What may interact with this medicine? -colchicine -heavy alcohol intake This list may not describe all possible  interactions. Give your health care provider a list of all the medicines, herbs, non-prescription drugs, or dietary supplements you use. Also tell them if you smoke, drink alcohol, or use illegal drugs. Some items may interact with your medicine. What should I watch for while using this medicine? Visit your doctor or health care professional regularly. You may need blood work done while you are taking this medicine. You may need to follow a special diet. Talk to your doctor. Limit your alcohol intake and avoid smoking to get the best benefit. What side effects may I notice from receiving this medicine? Side effects that you should report to your doctor or health care professional as soon as possible: -allergic reactions like skin rash, itching or hives, swelling of the face, lips, or tongue -blue tint to skin -chest tightness, pain -difficulty breathing, wheezing -dizziness -red, swollen painful area on the leg Side effects that usually do not require medical attention (report to your doctor or health care professional if they continue or are bothersome): -diarrhea -headache This list may not describe all possible side effects. Call your doctor for medical advice about side effects. You may report side effects to FDA at 1-800-FDA-1088. Where should I keep my medicine? Keep out of the reach of children. Store at room temperature between 15 and 30 degrees C (59 and 85 degrees F). Protect from light. Throw away any unused medicine after the expiration date. NOTE: This sheet is a summary. It may not cover all possible information. If you have questions about this medicine, talk to your doctor, pharmacist, or   health care provider.  2017 Elsevier/Gold Standard (2007-07-11 22:10:20)

## 2016-03-27 ENCOUNTER — Ambulatory Visit: Payer: BLUE CROSS/BLUE SHIELD

## 2016-04-10 ENCOUNTER — Ambulatory Visit (HOSPITAL_BASED_OUTPATIENT_CLINIC_OR_DEPARTMENT_OTHER): Payer: BLUE CROSS/BLUE SHIELD

## 2016-04-10 ENCOUNTER — Other Ambulatory Visit (HOSPITAL_BASED_OUTPATIENT_CLINIC_OR_DEPARTMENT_OTHER): Payer: BLUE CROSS/BLUE SHIELD

## 2016-04-10 ENCOUNTER — Ambulatory Visit (HOSPITAL_BASED_OUTPATIENT_CLINIC_OR_DEPARTMENT_OTHER): Payer: BLUE CROSS/BLUE SHIELD | Admitting: Hematology & Oncology

## 2016-04-10 ENCOUNTER — Telehealth: Payer: Self-pay

## 2016-04-10 VITALS — BP 119/83 | HR 84 | Temp 98.1°F | Resp 18 | Wt 167.8 lb

## 2016-04-10 DIAGNOSIS — E039 Hypothyroidism, unspecified: Secondary | ICD-10-CM

## 2016-04-10 DIAGNOSIS — D509 Iron deficiency anemia, unspecified: Secondary | ICD-10-CM

## 2016-04-10 DIAGNOSIS — D508 Other iron deficiency anemias: Secondary | ICD-10-CM

## 2016-04-10 DIAGNOSIS — D510 Vitamin B12 deficiency anemia due to intrinsic factor deficiency: Secondary | ICD-10-CM | POA: Diagnosis not present

## 2016-04-10 DIAGNOSIS — K909 Intestinal malabsorption, unspecified: Secondary | ICD-10-CM

## 2016-04-10 DIAGNOSIS — Z85820 Personal history of malignant melanoma of skin: Secondary | ICD-10-CM

## 2016-04-10 DIAGNOSIS — E538 Deficiency of other specified B group vitamins: Secondary | ICD-10-CM | POA: Diagnosis not present

## 2016-04-10 DIAGNOSIS — K2940 Chronic atrophic gastritis without bleeding: Secondary | ICD-10-CM

## 2016-04-10 LAB — CMP (CANCER CENTER ONLY)
ALBUMIN: 3.6 g/dL (ref 3.3–5.5)
ALT(SGPT): 41 U/L (ref 10–47)
AST: 33 U/L (ref 11–38)
Alkaline Phosphatase: 103 U/L — ABNORMAL HIGH (ref 26–84)
BILIRUBIN TOTAL: 0.6 mg/dL (ref 0.20–1.60)
BUN, Bld: 10 mg/dL (ref 7–22)
CALCIUM: 9.5 mg/dL (ref 8.0–10.3)
CO2: 29 meq/L (ref 18–33)
Chloride: 105 mEq/L (ref 98–108)
Creat: 0.8 mg/dl (ref 0.6–1.2)
GLUCOSE: 106 mg/dL (ref 73–118)
POTASSIUM: 3.9 meq/L (ref 3.3–4.7)
Sodium: 144 mEq/L (ref 128–145)
Total Protein: 7.1 g/dL (ref 6.4–8.1)

## 2016-04-10 LAB — CBC WITH DIFFERENTIAL (CANCER CENTER ONLY)
BASO#: 0 10*3/uL (ref 0.0–0.2)
BASO%: 0.4 % (ref 0.0–2.0)
EOS ABS: 0.4 10*3/uL (ref 0.0–0.5)
EOS%: 7.9 % — AB (ref 0.0–7.0)
HEMATOCRIT: 45.5 % (ref 34.8–46.6)
HEMOGLOBIN: 15.5 g/dL (ref 11.6–15.9)
LYMPH#: 2 10*3/uL (ref 0.9–3.3)
LYMPH%: 40.4 % (ref 14.0–48.0)
MCH: 29.1 pg (ref 26.0–34.0)
MCHC: 34.1 g/dL (ref 32.0–36.0)
MCV: 86 fL (ref 81–101)
MONO#: 0.4 10*3/uL (ref 0.1–0.9)
MONO%: 7.3 % (ref 0.0–13.0)
NEUT%: 44 % (ref 39.6–80.0)
NEUTROS ABS: 2.2 10*3/uL (ref 1.5–6.5)
Platelets: 218 10*3/uL (ref 145–400)
RBC: 5.32 10*6/uL (ref 3.70–5.32)
RDW: 12.8 % (ref 11.1–15.7)
WBC: 4.9 10*3/uL (ref 3.9–10.0)

## 2016-04-10 LAB — FERRITIN: Ferritin: 36 ng/ml (ref 9–269)

## 2016-04-10 LAB — IRON AND TIBC
%SAT: 22 % (ref 21–57)
IRON: 68 ug/dL (ref 41–142)
TIBC: 310 ug/dL (ref 236–444)
UIBC: 242 ug/dL (ref 120–384)

## 2016-04-10 MED ORDER — CYANOCOBALAMIN 1000 MCG/ML IJ SOLN
1500.0000 ug | Freq: Once | INTRAMUSCULAR | Status: AC
  Administered 2016-04-10: 1500 ug via INTRAMUSCULAR

## 2016-04-10 MED ORDER — CYANOCOBALAMIN 1000 MCG/ML IJ SOLN
INTRAMUSCULAR | Status: AC
  Filled 2016-04-10: qty 1

## 2016-04-10 NOTE — Telephone Encounter (Signed)
Pt notified of Dr Antonieta Pert note related to iron results. Verbalizes understanding. dph

## 2016-04-10 NOTE — Telephone Encounter (Signed)
-----   Message from Volanda Napoleon, MD sent at 04/10/2016  2:01 PM EST ----- Call - iron is borderline!!  Keep up the iron in your diet!!!  pete

## 2016-04-10 NOTE — Progress Notes (Signed)
Hematology and Oncology Follow Up Visit  Katherine Bradford [id number] Feb 23, 1961 55 y.o. 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 She had a good Christmas. She had a nice Thanksgiving. She is off 10 days. 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  Her last B-12 level in September was 400.   She had been taking B-12 every 2 weeks. She wants to try once a month. I will increase her B-12 dose to 1.5 mg.  She's had no obvious bleeding. She's had no bruising. She is exercising. She is trying to lose a little weight.  She's had no rashes. She did have poison oak point saw her last September. She does have some eczema. I'm sure this is another autoimmune disease that she probably has.  She's had no change in bowel or bladder habits.  She will be due for a mammogram in January.  Overall, her performance status is ECOG 0.   Medications:  Current Outpatient Prescriptions:  .  Ascorbic Acid (VITAMIN C) 500 MG CAPS, Take by mouth every morning., Disp: , Rfl:  .  calcium carbonate (OS-CAL) 600 MG TABS tablet, Take 1 tablet by mouth daily., Disp: , Rfl:  .  calcium carbonate (OS-CAL) 600 MG TABS tablet, Take by mouth., Disp: , Rfl:  .  calcium carbonate (TUMS - DOSED IN MG ELEMENTAL CALCIUM) 500 MG chewable tablet, Chew 1 tablet by mouth daily., Disp: , Rfl:  .  cholecalciferol (VITAMIN D) 1000 UNITS tablet, Take 2,000 Units by mouth daily., Disp: , Rfl:  .  cyanocobalamin (,VITAMIN B-12,) 1000 MCG/ML injection, Inject 1,000 mcg into the muscle every 30 (thirty) days., Disp: , Rfl:  .  folic acid (FOLVITE) 1 MG tablet, Take 2 tablets (2 mg total) by mouth daily., Disp: 180  tablet, Rfl: 3 .  levothyroxine (SYNTHROID) 75 MCG tablet, Take by mouth., Disp: , Rfl:  .  magnesium 30 MG tablet, Take 30 mg by mouth 1 day or 1 dose., Disp: , Rfl:  .  vitamin E 100 UNIT capsule, Take 100 Units by mouth daily. , Disp: , Rfl:   Allergies:  Allergies  Allergen Reactions  . Vancomycin     Red neck syndrome from IV Vanco.  . Other     narcotics  . Prednisone Other (See Comments)    Patient prefers not to take due to the edginess it gives her  . Epinephrine Palpitations  . Keflex [Cephalexin] Rash    Past Medical History, Surgical history, Social history, and Family History were reviewed and updated.  Review of Systems:  As above   Physical Exam:  weight is 167 lb 12.8 oz (76.1 kg). Her oral temperature is 98.1 F (36.7 C). Her blood pressure is 119/83 and her pulse is 84. Her respiration is 18 and oxygen saturation is 99%.   Wt Readings from Last 3 Encounters:  04/10/16 167 lb 12.8 oz (76.1 kg)  01/03/16 169 lb 1.9 oz (76.7 kg)  08/30/15 171 lb (77.6 kg)      Well-developed well-nourished white female in no obvious distress. Head and neck exam shows no ocular or oral lesions. She has no palpable cervical or supraclavicular lymph nodes. Lungs  are clear. Cardiac exam regular rate and rhythm with no murmurs, rubs or bruits. Abdomen is soft. She has good bowel sounds. There is no fluid wave. There is no palpable liver or spleen tip. Extremities shows no clubbing, cyanosis or edema. She has a wide local excision scar in the right inner thigh. This is well-healed. Neurological exam shows no focal neurological deficits. Back exam shows no tenderness over the spine, ribs or hips. Skin exam shows some areas of eczema.   Lab Results  Component Value Date   WBC 4.9 04/10/2016   HGB 15.5 04/10/2016   HCT 45.5 04/10/2016   MCV 86 04/10/2016   PLT 218 04/10/2016     Chemistry      Component Value Date/Time   NA 139 05/03/2015 0827   K 4.1 05/03/2015 0827   CL 105  11/09/2014 0859   CO2 26 05/03/2015 0827   BUN 12.7 05/03/2015 0827   CREATININE 1.0 05/03/2015 0827      Component Value Date/Time   CALCIUM 9.4 05/03/2015 0827   ALKPHOS 93 05/03/2015 0827   AST 17 05/03/2015 0827   ALT 17 05/03/2015 0827   BILITOT 0.46 05/03/2015 0827         Impression and Plan: Ms. Ruggles Is a 55 year old white female. She has pernicious anemia. She has intrinsic factor antibodies. We will increase the dose of vitamin B-12. Hopefully this will make her feel better and we can move her appointment so monthly.  We'll see what her iron levels are.  Her family doctor follows her hypothyroidism.  I will see her back in about 3 months.   Jamal Maes, MD A 12/29/20178:58 AM

## 2016-04-10 NOTE — Patient Instructions (Signed)
Cyanocobalamin, Vitamin B12 injection What is this medicine? CYANOCOBALAMIN (sye an oh koe BAL a min) is a man made form of vitamin B12. Vitamin B12 is used in the growth of healthy blood cells, nerve cells, and proteins in the body. It also helps with the metabolism of fats and carbohydrates. This medicine is used to treat people who can not absorb vitamin B12. This medicine may be used for other purposes; ask your health care provider or pharmacist if you have questions. COMMON BRAND NAME(S): B-12 Compliance Kit, B-12 Injection Kit, Cyomin, LA-12, Nutri-Twelve, Physicians EZ Use B-12, Primabalt What should I tell my health care provider before I take this medicine? They need to know if you have any of these conditions: -kidney disease -Leber's disease -megaloblastic anemia -an unusual or allergic reaction to cyanocobalamin, cobalt, other medicines, foods, dyes, or preservatives -pregnant or trying to get pregnant -breast-feeding How should I use this medicine? This medicine is injected into a muscle or deeply under the skin. It is usually given by a health care professional in a clinic or doctor's office. However, your doctor may teach you how to inject yourself. Follow all instructions. Talk to your pediatrician regarding the use of this medicine in children. Special care may be needed. Overdosage: If you think you have taken too much of this medicine contact a poison control center or emergency room at once. NOTE: This medicine is only for you. Do not share this medicine with others. What if I miss a dose? If you are given your dose at a clinic or doctor's office, call to reschedule your appointment. If you give your own injections and you miss a dose, take it as soon as you can. If it is almost time for your next dose, take only that dose. Do not take double or extra doses. What may interact with this medicine? -colchicine -heavy alcohol intake This list may not describe all possible  interactions. Give your health care provider a list of all the medicines, herbs, non-prescription drugs, or dietary supplements you use. Also tell them if you smoke, drink alcohol, or use illegal drugs. Some items may interact with your medicine. What should I watch for while using this medicine? Visit your doctor or health care professional regularly. You may need blood work done while you are taking this medicine. You may need to follow a special diet. Talk to your doctor. Limit your alcohol intake and avoid smoking to get the best benefit. What side effects may I notice from receiving this medicine? Side effects that you should report to your doctor or health care professional as soon as possible: -allergic reactions like skin rash, itching or hives, swelling of the face, lips, or tongue -blue tint to skin -chest tightness, pain -difficulty breathing, wheezing -dizziness -red, swollen painful area on the leg Side effects that usually do not require medical attention (report to your doctor or health care professional if they continue or are bothersome): -diarrhea -headache This list may not describe all possible side effects. Call your doctor for medical advice about side effects. You may report side effects to FDA at 1-800-FDA-1088. Where should I keep my medicine? Keep out of the reach of children. Store at room temperature between 15 and 30 degrees C (59 and 85 degrees F). Protect from light. Throw away any unused medicine after the expiration date. NOTE: This sheet is a summary. It may not cover all possible information. If you have questions about this medicine, talk to your doctor, pharmacist, or   health care provider.  2017 Elsevier/Gold Standard (2007-07-11 22:10:20)  

## 2016-04-11 LAB — RETICULOCYTES: Reticulocyte Count: 0.9 % (ref 0.6–2.6)

## 2016-04-11 LAB — VITAMIN B12: Vitamin B12: 408 pg/mL (ref 232–1245)

## 2016-04-29 ENCOUNTER — Ambulatory Visit: Payer: BLUE CROSS/BLUE SHIELD

## 2016-05-08 ENCOUNTER — Ambulatory Visit (HOSPITAL_BASED_OUTPATIENT_CLINIC_OR_DEPARTMENT_OTHER): Payer: BLUE CROSS/BLUE SHIELD

## 2016-05-08 VITALS — BP 127/69 | HR 70 | Temp 97.6°F | Resp 16

## 2016-05-08 DIAGNOSIS — D510 Vitamin B12 deficiency anemia due to intrinsic factor deficiency: Secondary | ICD-10-CM

## 2016-05-08 DIAGNOSIS — D509 Iron deficiency anemia, unspecified: Secondary | ICD-10-CM

## 2016-05-08 MED ORDER — CYANOCOBALAMIN 1000 MCG/ML IJ SOLN
1500.0000 ug | Freq: Once | INTRAMUSCULAR | Status: AC
  Administered 2016-05-08: 1000 ug via INTRAMUSCULAR

## 2016-05-08 MED ORDER — CYANOCOBALAMIN 1000 MCG/ML IJ SOLN
INTRAMUSCULAR | Status: AC
  Filled 2016-05-08: qty 1

## 2016-05-08 NOTE — Patient Instructions (Signed)
Cyanocobalamin, Vitamin B12 injection What is this medicine? CYANOCOBALAMIN (sye an oh koe BAL a min) is a man made form of vitamin B12. Vitamin B12 is used in the growth of healthy blood cells, nerve cells, and proteins in the body. It also helps with the metabolism of fats and carbohydrates. This medicine is used to treat people who can not absorb vitamin B12. This medicine may be used for other purposes; ask your health care provider or pharmacist if you have questions. COMMON BRAND NAME(S): B-12 Compliance Kit, B-12 Injection Kit, Cyomin, LA-12, Nutri-Twelve, Physicians EZ Use B-12, Primabalt What should I tell my health care provider before I take this medicine? They need to know if you have any of these conditions: -kidney disease -Leber's disease -megaloblastic anemia -an unusual or allergic reaction to cyanocobalamin, cobalt, other medicines, foods, dyes, or preservatives -pregnant or trying to get pregnant -breast-feeding How should I use this medicine? This medicine is injected into a muscle or deeply under the skin. It is usually given by a health care professional in a clinic or doctor's office. However, your doctor may teach you how to inject yourself. Follow all instructions. Talk to your pediatrician regarding the use of this medicine in children. Special care may be needed. Overdosage: If you think you have taken too much of this medicine contact a poison control center or emergency room at once. NOTE: This medicine is only for you. Do not share this medicine with others. What if I miss a dose? If you are given your dose at a clinic or doctor's office, call to reschedule your appointment. If you give your own injections and you miss a dose, take it as soon as you can. If it is almost time for your next dose, take only that dose. Do not take double or extra doses. What may interact with this medicine? -colchicine -heavy alcohol intake This list may not describe all possible  interactions. Give your health care provider a list of all the medicines, herbs, non-prescription drugs, or dietary supplements you use. Also tell them if you smoke, drink alcohol, or use illegal drugs. Some items may interact with your medicine. What should I watch for while using this medicine? Visit your doctor or health care professional regularly. You may need blood work done while you are taking this medicine. You may need to follow a special diet. Talk to your doctor. Limit your alcohol intake and avoid smoking to get the best benefit. What side effects may I notice from receiving this medicine? Side effects that you should report to your doctor or health care professional as soon as possible: -allergic reactions like skin rash, itching or hives, swelling of the face, lips, or tongue -blue tint to skin -chest tightness, pain -difficulty breathing, wheezing -dizziness -red, swollen painful area on the leg Side effects that usually do not require medical attention (report to your doctor or health care professional if they continue or are bothersome): -diarrhea -headache This list may not describe all possible side effects. Call your doctor for medical advice about side effects. You may report side effects to FDA at 1-800-FDA-1088. Where should I keep my medicine? Keep out of the reach of children. Store at room temperature between 15 and 30 degrees C (59 and 85 degrees F). Protect from light. Throw away any unused medicine after the expiration date. NOTE: This sheet is a summary. It may not cover all possible information. If you have questions about this medicine, talk to your doctor, pharmacist, or   health care provider.  2017 Elsevier/Gold Standard (2007-07-11 22:10:20)  

## 2016-06-05 ENCOUNTER — Ambulatory Visit (HOSPITAL_BASED_OUTPATIENT_CLINIC_OR_DEPARTMENT_OTHER): Payer: BLUE CROSS/BLUE SHIELD

## 2016-06-05 VITALS — BP 134/57 | HR 71 | Temp 98.1°F | Resp 17

## 2016-06-05 DIAGNOSIS — D509 Iron deficiency anemia, unspecified: Secondary | ICD-10-CM

## 2016-06-05 DIAGNOSIS — D510 Vitamin B12 deficiency anemia due to intrinsic factor deficiency: Secondary | ICD-10-CM

## 2016-06-05 MED ORDER — CYANOCOBALAMIN 1000 MCG/ML IJ SOLN
INTRAMUSCULAR | Status: AC
  Filled 2016-06-05: qty 1

## 2016-06-05 MED ORDER — CYANOCOBALAMIN 1000 MCG/ML IJ SOLN
1500.0000 ug | Freq: Once | INTRAMUSCULAR | Status: AC
  Administered 2016-06-05: 1500 ug via INTRAMUSCULAR

## 2016-06-05 NOTE — Patient Instructions (Signed)
Cyanocobalamin, Vitamin B12 injection What is this medicine? CYANOCOBALAMIN (sye an oh koe BAL a min) is a man made form of vitamin B12. Vitamin B12 is used in the growth of healthy blood cells, nerve cells, and proteins in the body. It also helps with the metabolism of fats and carbohydrates. This medicine is used to treat people who can not absorb vitamin B12. This medicine may be used for other purposes; ask your health care provider or pharmacist if you have questions. COMMON BRAND NAME(S): B-12 Compliance Kit, B-12 Injection Kit, Cyomin, LA-12, Nutri-Twelve, Physicians EZ Use B-12, Primabalt What should I tell my health care provider before I take this medicine? They need to know if you have any of these conditions: -kidney disease -Leber's disease -megaloblastic anemia -an unusual or allergic reaction to cyanocobalamin, cobalt, other medicines, foods, dyes, or preservatives -pregnant or trying to get pregnant -breast-feeding How should I use this medicine? This medicine is injected into a muscle or deeply under the skin. It is usually given by a health care professional in a clinic or doctor's office. However, your doctor may teach you how to inject yourself. Follow all instructions. Talk to your pediatrician regarding the use of this medicine in children. Special care may be needed. Overdosage: If you think you have taken too much of this medicine contact a poison control center or emergency room at once. NOTE: This medicine is only for you. Do not share this medicine with others. What if I miss a dose? If you are given your dose at a clinic or doctor's office, call to reschedule your appointment. If you give your own injections and you miss a dose, take it as soon as you can. If it is almost time for your next dose, take only that dose. Do not take double or extra doses. What may interact with this medicine? -colchicine -heavy alcohol intake This list may not describe all possible  interactions. Give your health care provider a list of all the medicines, herbs, non-prescription drugs, or dietary supplements you use. Also tell them if you smoke, drink alcohol, or use illegal drugs. Some items may interact with your medicine. What should I watch for while using this medicine? Visit your doctor or health care professional regularly. You may need blood work done while you are taking this medicine. You may need to follow a special diet. Talk to your doctor. Limit your alcohol intake and avoid smoking to get the best benefit. What side effects may I notice from receiving this medicine? Side effects that you should report to your doctor or health care professional as soon as possible: -allergic reactions like skin rash, itching or hives, swelling of the face, lips, or tongue -blue tint to skin -chest tightness, pain -difficulty breathing, wheezing -dizziness -red, swollen painful area on the leg Side effects that usually do not require medical attention (report to your doctor or health care professional if they continue or are bothersome): -diarrhea -headache This list may not describe all possible side effects. Call your doctor for medical advice about side effects. You may report side effects to FDA at 1-800-FDA-1088. Where should I keep my medicine? Keep out of the reach of children. Store at room temperature between 15 and 30 degrees C (59 and 85 degrees F). Protect from light. Throw away any unused medicine after the expiration date. NOTE: This sheet is a summary. It may not cover all possible information. If you have questions about this medicine, talk to your doctor, pharmacist, or   health care provider.  2017 Elsevier/Gold Standard (2007-07-11 22:10:20)  

## 2016-06-11 ENCOUNTER — Ambulatory Visit
Admission: RE | Admit: 2016-06-11 | Discharge: 2016-06-11 | Disposition: A | Discharge status: Home / Self Care | Payer: BLUE CROSS/BLUE SHIELD | Source: Ambulatory Visit | Attending: Obstetrics and Gynecology | Admitting: Obstetrics and Gynecology

## 2016-06-11 DIAGNOSIS — Z1231 Encounter for screening mammogram for malignant neoplasm of breast: Secondary | ICD-10-CM | POA: Diagnosis not present

## 2016-07-03 ENCOUNTER — Ambulatory Visit (HOSPITAL_BASED_OUTPATIENT_CLINIC_OR_DEPARTMENT_OTHER): Payer: BLUE CROSS/BLUE SHIELD | Admitting: Hematology & Oncology

## 2016-07-03 ENCOUNTER — Telehealth: Payer: Self-pay | Admitting: *Deleted

## 2016-07-03 ENCOUNTER — Ambulatory Visit (HOSPITAL_BASED_OUTPATIENT_CLINIC_OR_DEPARTMENT_OTHER): Payer: BLUE CROSS/BLUE SHIELD

## 2016-07-03 ENCOUNTER — Other Ambulatory Visit (HOSPITAL_BASED_OUTPATIENT_CLINIC_OR_DEPARTMENT_OTHER): Payer: BLUE CROSS/BLUE SHIELD

## 2016-07-03 VITALS — BP 116/76 | HR 79 | Temp 98.0°F | Resp 18 | Wt 167.2 lb

## 2016-07-03 DIAGNOSIS — D508 Other iron deficiency anemias: Secondary | ICD-10-CM

## 2016-07-03 DIAGNOSIS — D509 Iron deficiency anemia, unspecified: Secondary | ICD-10-CM

## 2016-07-03 DIAGNOSIS — D510 Vitamin B12 deficiency anemia due to intrinsic factor deficiency: Secondary | ICD-10-CM

## 2016-07-03 DIAGNOSIS — Z85820 Personal history of malignant melanoma of skin: Secondary | ICD-10-CM

## 2016-07-03 DIAGNOSIS — E538 Deficiency of other specified B group vitamins: Secondary | ICD-10-CM | POA: Diagnosis not present

## 2016-07-03 LAB — CBC WITH DIFFERENTIAL (CANCER CENTER ONLY)
BASO#: 0.1 10*3/uL (ref 0.0–0.2)
BASO%: 1.2 % (ref 0.0–2.0)
EOS%: 6.1 % (ref 0.0–7.0)
Eosinophils Absolute: 0.3 10*3/uL (ref 0.0–0.5)
HCT: 46.6 % (ref 34.8–46.6)
HGB: 15.7 g/dL (ref 11.6–15.9)
LYMPH#: 1.7 10*3/uL (ref 0.9–3.3)
LYMPH%: 40.3 % (ref 14.0–48.0)
MCH: 29.1 pg (ref 26.0–34.0)
MCHC: 33.7 g/dL (ref 32.0–36.0)
MCV: 86 fL (ref 81–101)
MONO#: 0.3 10*3/uL (ref 0.1–0.9)
MONO%: 7.5 % (ref 0.0–13.0)
NEUT#: 1.9 10*3/uL (ref 1.5–6.5)
NEUT%: 44.9 % (ref 39.6–80.0)
PLATELETS: 240 10*3/uL (ref 145–400)
RBC: 5.4 10*6/uL — AB (ref 3.70–5.32)
RDW: 13.3 % (ref 11.1–15.7)
WBC: 4.3 10*3/uL (ref 3.9–10.0)

## 2016-07-03 LAB — COMPREHENSIVE METABOLIC PANEL
ALK PHOS: 140 U/L (ref 40–150)
ALT: 21 U/L (ref 0–55)
ANION GAP: 9 meq/L (ref 3–11)
AST: 18 U/L (ref 5–34)
Albumin: 4.2 g/dL (ref 3.5–5.0)
BUN: 17.1 mg/dL (ref 7.0–26.0)
CALCIUM: 9.4 mg/dL (ref 8.4–10.4)
CHLORIDE: 106 meq/L (ref 98–109)
CO2: 26 mEq/L (ref 22–29)
Creatinine: 0.8 mg/dL (ref 0.6–1.1)
EGFR: 78 mL/min/{1.73_m2} — ABNORMAL LOW (ref >90)
Glucose: 98 mg/dl (ref 70–140)
POTASSIUM: 3.9 meq/L (ref 3.5–5.1)
Sodium: 141 mEq/L (ref 136–145)
Total Bilirubin: 0.38 mg/dL (ref 0.20–1.20)
Total Protein: 7.6 g/dL (ref 6.4–8.3)

## 2016-07-03 LAB — IRON AND TIBC
%SAT: 19 % — ABNORMAL LOW (ref 21–57)
IRON: 56 ug/dL (ref 41–142)
TIBC: 298 ug/dL (ref 236–444)
UIBC: 242 ug/dL (ref 120–384)

## 2016-07-03 LAB — FERRITIN: FERRITIN: 45 ng/mL (ref 9–269)

## 2016-07-03 MED ORDER — CYANOCOBALAMIN 1000 MCG/ML IJ SOLN
INTRAMUSCULAR | Status: AC
  Filled 2016-07-03: qty 1

## 2016-07-03 MED ORDER — CYANOCOBALAMIN 1000 MCG/ML IJ SOLN
1500.0000 ug | Freq: Once | INTRAMUSCULAR | Status: AC
  Administered 2016-07-03: 1500 ug via INTRAMUSCULAR

## 2016-07-03 NOTE — Progress Notes (Signed)
Hematology and Oncology Follow Up Visit  Katherine Bradford [id number] 1961-01-28 56 y.o. 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 She is doing quite well. 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 However, this is not stopping her from work. 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  Would last saw her back in December, her iron studies do not look all that bad. Her ferritin was 36 and iron saturation of 22%.  Also back in December, her vitamin B-12 level was 408.  She's had no problems with bleeding. She's had no change in bowel or bladder habits. She's had no cough or shortness of breath.  She's got through the winter without having influenza.  She is looking for to Easter.  Overall, her performance status is ECOG 0.   Medications:  Current Outpatient Prescriptions:  .  Ascorbic Acid (VITAMIN C) 500 MG CAPS, Take by mouth every morning., Disp: , Rfl:  .  calcium carbonate (OS-CAL) 600 MG TABS tablet, Take 1 tablet by mouth daily., Disp: , Rfl:  .  calcium carbonate (OS-CAL) 600 MG TABS tablet, Take by mouth., Disp: , Rfl:  .  calcium carbonate (TUMS - DOSED IN MG ELEMENTAL CALCIUM) 500 MG chewable tablet, Chew 1 tablet by mouth daily., Disp: , Rfl:  .  cholecalciferol (VITAMIN D) 1000 UNITS tablet, Take 2,000 Units by mouth daily., Disp: , Rfl:  .  cyanocobalamin (,VITAMIN B-12,) 1000 MCG/ML injection, Inject 1,000 mcg into the muscle every 30 (thirty) days., Disp: , Rfl:  .  folic acid (FOLVITE) 1 MG tablet, Take 2 tablets (2 mg total) by mouth daily., Disp: 180 tablet, Rfl: 3 .  levothyroxine (SYNTHROID) 75 MCG tablet, Take by  mouth., Disp: , Rfl:  .  magnesium 30 MG tablet, Take 30 mg by mouth 1 day or 1 dose., Disp: , Rfl:  .  vitamin E 100 UNIT capsule, Take 100 Units by mouth daily. , Disp: , Rfl:   Allergies:  Allergies  Allergen Reactions  . Vancomycin     Red neck syndrome from IV Vanco.  . Other     narcotics  . Prednisone Other (See Comments)    Patient prefers not to take due to the edginess it gives her  . Epinephrine Palpitations  . Keflex [Cephalexin] Rash    Past Medical History, Surgical history, Social history, and Family History were reviewed and updated.  Review of Systems:  As above   Physical Exam:  weight is 167 lb 12.8 oz (76.1 kg). Her oral temperature is 98.1 F (36.7 C). Her blood pressure is 119/83 and her pulse is 84. Her respiration is 18 and oxygen saturation is 99%.   Wt Readings from Last 3 Encounters:  04/10/16 167 lb 12.8 oz (76.1 kg)  01/03/16 169 lb 1.9 oz (76.7 kg)  08/30/15 171 lb (77.6 kg)      Well-developed well-nourished white female in no obvious distress. Head and neck exam shows no ocular or oral lesions. She has no palpable cervical or supraclavicular lymph nodes. Lungs are clear. Cardiac exam regular rate and rhythm with no murmurs, rubs or bruits. Abdomen is  soft. She has good bowel sounds. There is no fluid wave. There is no palpable liver or spleen tip. Extremities shows no clubbing, cyanosis or edema. She has a wide local excision scar in the right inner thigh. This is well-healed. Neurological exam shows no focal neurological deficits. Back exam shows no tenderness over the spine, ribs or hips. Skin exam shows some areas of eczema.   Lab Results  Component Value Date   WBC 4.9 04/10/2016   HGB 15.5 04/10/2016   HCT 45.5 04/10/2016   MCV 86 04/10/2016   PLT 218 04/10/2016     Chemistry      Component Value Date/Time   NA 139 05/03/2015 0827   K 4.1 05/03/2015 0827   CL 105 11/09/2014 0859   CO2 26 05/03/2015 0827   BUN 12.7 05/03/2015 0827     CREATININE 1.0 05/03/2015 0827      Component Value Date/Time   CALCIUM 9.4 05/03/2015 0827   ALKPHOS 93 05/03/2015 0827   AST 17 05/03/2015 0827   ALT 17 05/03/2015 0827   BILITOT 0.46 05/03/2015 0827         Impression and Plan: Katherine Bradford Is a 56 year old white female. She has pernicious anemia. She has intrinsic factor antibodies.  I would not think that prior levels would be low. Her hemoglobin is best that it has been probably in about a year. Her MCV is holding stable.  We will continue to have her come back monthly for her vitamin B-12.  I think we can move her appointments to see [place] in 4 months now.   Volanda Napoleon, MD  12/29/20178:58 AM

## 2016-07-03 NOTE — Telephone Encounter (Addendum)
Patient aware of results  ----- Message from Volanda Napoleon, MD sent at 07/03/2016  2:10 PM EDT ----- Call - iron is ok!!!  pete

## 2016-07-04 LAB — VITAMIN B12: VITAMIN B 12: 426 pg/mL (ref 232–1245)

## 2016-07-13 DIAGNOSIS — D225 Melanocytic nevi of trunk: Secondary | ICD-10-CM | POA: Diagnosis not present

## 2016-07-13 DIAGNOSIS — Z86018 Personal history of other benign neoplasm: Secondary | ICD-10-CM | POA: Diagnosis not present

## 2016-07-13 DIAGNOSIS — L821 Other seborrheic keratosis: Secondary | ICD-10-CM | POA: Diagnosis not present

## 2016-07-13 DIAGNOSIS — L570 Actinic keratosis: Secondary | ICD-10-CM | POA: Diagnosis not present

## 2016-07-13 DIAGNOSIS — Z85820 Personal history of malignant melanoma of skin: Secondary | ICD-10-CM | POA: Diagnosis not present

## 2016-07-30 DIAGNOSIS — E890 Postprocedural hypothyroidism: Secondary | ICD-10-CM | POA: Diagnosis not present

## 2016-07-30 DIAGNOSIS — M8580 Other specified disorders of bone density and structure, unspecified site: Secondary | ICD-10-CM | POA: Diagnosis not present

## 2016-07-31 ENCOUNTER — Ambulatory Visit (HOSPITAL_BASED_OUTPATIENT_CLINIC_OR_DEPARTMENT_OTHER): Payer: BLUE CROSS/BLUE SHIELD

## 2016-07-31 VITALS — HR 77 | Temp 97.6°F

## 2016-07-31 DIAGNOSIS — D509 Iron deficiency anemia, unspecified: Secondary | ICD-10-CM

## 2016-07-31 DIAGNOSIS — D510 Vitamin B12 deficiency anemia due to intrinsic factor deficiency: Secondary | ICD-10-CM | POA: Diagnosis not present

## 2016-07-31 MED ORDER — CYANOCOBALAMIN 1000 MCG/ML IJ SOLN
1500.0000 ug | Freq: Once | INTRAMUSCULAR | Status: AC
  Administered 2016-07-31: 1500 ug via INTRAMUSCULAR

## 2016-07-31 MED ORDER — CYANOCOBALAMIN 1000 MCG/ML IJ SOLN
INTRAMUSCULAR | Status: AC
  Filled 2016-07-31: qty 2

## 2016-07-31 NOTE — Patient Instructions (Signed)
Cyanocobalamin, Vitamin B12 injection What is this medicine? CYANOCOBALAMIN (sye an oh koe BAL a min) is a man made form of vitamin B12. Vitamin B12 is used in the growth of healthy blood cells, nerve cells, and proteins in the body. It also helps with the metabolism of fats and carbohydrates. This medicine is used to treat people who can not absorb vitamin B12. This medicine may be used for other purposes; ask your health care provider or pharmacist if you have questions. COMMON BRAND NAME(S): B-12 Compliance Kit, B-12 Injection Kit, Cyomin, LA-12, Nutri-Twelve, Physicians EZ Use B-12, Primabalt What should I tell my health care provider before I take this medicine? They need to know if you have any of these conditions: -kidney disease -Leber's disease -megaloblastic anemia -an unusual or allergic reaction to cyanocobalamin, cobalt, other medicines, foods, dyes, or preservatives -pregnant or trying to get pregnant -breast-feeding How should I use this medicine? This medicine is injected into a muscle or deeply under the skin. It is usually given by a health care professional in a clinic or doctor's office. However, your doctor may teach you how to inject yourself. Follow all instructions. Talk to your pediatrician regarding the use of this medicine in children. Special care may be needed. Overdosage: If you think you have taken too much of this medicine contact a poison control center or emergency room at once. NOTE: This medicine is only for you. Do not share this medicine with others. What if I miss a dose? If you are given your dose at a clinic or doctor's office, call to reschedule your appointment. If you give your own injections and you miss a dose, take it as soon as you can. If it is almost time for your next dose, take only that dose. Do not take double or extra doses. What may interact with this medicine? -colchicine -heavy alcohol intake This list may not describe all possible  interactions. Give your health care provider a list of all the medicines, herbs, non-prescription drugs, or dietary supplements you use. Also tell them if you smoke, drink alcohol, or use illegal drugs. Some items may interact with your medicine. What should I watch for while using this medicine? Visit your doctor or health care professional regularly. You may need blood work done while you are taking this medicine. You may need to follow a special diet. Talk to your doctor. Limit your alcohol intake and avoid smoking to get the best benefit. What side effects may I notice from receiving this medicine? Side effects that you should report to your doctor or health care professional as soon as possible: -allergic reactions like skin rash, itching or hives, swelling of the face, lips, or tongue -blue tint to skin -chest tightness, pain -difficulty breathing, wheezing -dizziness -red, swollen painful area on the leg Side effects that usually do not require medical attention (report to your doctor or health care professional if they continue or are bothersome): -diarrhea -headache This list may not describe all possible side effects. Call your doctor for medical advice about side effects. You may report side effects to FDA at 1-800-FDA-1088. Where should I keep my medicine? Keep out of the reach of children. Store at room temperature between 15 and 30 degrees C (59 and 85 degrees F). Protect from light. Throw away any unused medicine after the expiration date. NOTE: This sheet is a summary. It may not cover all possible information. If you have questions about this medicine, talk to your doctor, pharmacist, or   health care provider.  2017 Elsevier/Gold Standard (2007-07-11 22:10:20)  

## 2016-08-10 DIAGNOSIS — Z6827 Body mass index (BMI) 27.0-27.9, adult: Secondary | ICD-10-CM | POA: Diagnosis not present

## 2016-08-10 DIAGNOSIS — Z01419 Encounter for gynecological examination (general) (routine) without abnormal findings: Secondary | ICD-10-CM | POA: Diagnosis not present

## 2016-08-28 ENCOUNTER — Ambulatory Visit: Payer: BLUE CROSS/BLUE SHIELD

## 2016-09-24 ENCOUNTER — Ambulatory Visit (HOSPITAL_BASED_OUTPATIENT_CLINIC_OR_DEPARTMENT_OTHER): Payer: BLUE CROSS/BLUE SHIELD

## 2016-09-24 VITALS — BP 118/70 | HR 65 | Temp 97.5°F | Resp 16

## 2016-09-24 DIAGNOSIS — D510 Vitamin B12 deficiency anemia due to intrinsic factor deficiency: Secondary | ICD-10-CM | POA: Diagnosis not present

## 2016-09-24 DIAGNOSIS — D509 Iron deficiency anemia, unspecified: Secondary | ICD-10-CM

## 2016-09-24 MED ORDER — CYANOCOBALAMIN 1000 MCG/ML IJ SOLN
INTRAMUSCULAR | Status: AC
  Filled 2016-09-24: qty 1

## 2016-09-24 MED ORDER — CYANOCOBALAMIN 1000 MCG/ML IJ SOLN
1500.0000 ug | Freq: Once | INTRAMUSCULAR | Status: AC
  Administered 2016-09-24: 1500 ug via INTRAMUSCULAR

## 2016-09-25 ENCOUNTER — Ambulatory Visit: Payer: BLUE CROSS/BLUE SHIELD

## 2016-10-23 ENCOUNTER — Ambulatory Visit (HOSPITAL_BASED_OUTPATIENT_CLINIC_OR_DEPARTMENT_OTHER): Payer: BLUE CROSS/BLUE SHIELD

## 2016-10-23 ENCOUNTER — Other Ambulatory Visit (HOSPITAL_BASED_OUTPATIENT_CLINIC_OR_DEPARTMENT_OTHER): Payer: BLUE CROSS/BLUE SHIELD

## 2016-10-23 ENCOUNTER — Ambulatory Visit (HOSPITAL_BASED_OUTPATIENT_CLINIC_OR_DEPARTMENT_OTHER): Payer: BLUE CROSS/BLUE SHIELD | Admitting: Family

## 2016-10-23 VITALS — BP 125/66 | HR 69 | Temp 98.3°F | Resp 16 | Wt 170.0 lb

## 2016-10-23 DIAGNOSIS — D508 Other iron deficiency anemias: Secondary | ICD-10-CM

## 2016-10-23 DIAGNOSIS — D510 Vitamin B12 deficiency anemia due to intrinsic factor deficiency: Secondary | ICD-10-CM

## 2016-10-23 DIAGNOSIS — E538 Deficiency of other specified B group vitamins: Secondary | ICD-10-CM

## 2016-10-23 DIAGNOSIS — D509 Iron deficiency anemia, unspecified: Secondary | ICD-10-CM

## 2016-10-23 DIAGNOSIS — K2940 Chronic atrophic gastritis without bleeding: Secondary | ICD-10-CM | POA: Diagnosis not present

## 2016-10-23 DIAGNOSIS — Z85820 Personal history of malignant melanoma of skin: Secondary | ICD-10-CM

## 2016-10-23 LAB — CMP (CANCER CENTER ONLY)
ALBUMIN: 3.6 g/dL (ref 3.3–5.5)
ALK PHOS: 110 U/L — AB (ref 26–84)
ALT: 30 U/L (ref 10–47)
AST: 26 U/L (ref 11–38)
BILIRUBIN TOTAL: 0.6 mg/dL (ref 0.20–1.60)
BUN, Bld: 13 mg/dL (ref 7–22)
CALCIUM: 9.4 mg/dL (ref 8.0–10.3)
CO2: 30 meq/L (ref 18–33)
Chloride: 104 mEq/L (ref 98–108)
Creat: 0.8 mg/dl (ref 0.6–1.2)
GLUCOSE: 122 mg/dL — AB (ref 73–118)
POTASSIUM: 3.7 meq/L (ref 3.3–4.7)
Sodium: 143 mEq/L (ref 128–145)
Total Protein: 7.2 g/dL (ref 6.4–8.1)

## 2016-10-23 LAB — IRON AND TIBC
%SAT: 24 % (ref 21–57)
Iron: 72 ug/dL (ref 41–142)
TIBC: 303 ug/dL (ref 236–444)
UIBC: 230 ug/dL (ref 120–384)

## 2016-10-23 LAB — CBC WITH DIFFERENTIAL (CANCER CENTER ONLY)
BASO#: 0 10*3/uL (ref 0.0–0.2)
BASO%: 0.4 % (ref 0.0–2.0)
EOS%: 5.9 % (ref 0.0–7.0)
Eosinophils Absolute: 0.3 10*3/uL (ref 0.0–0.5)
HEMATOCRIT: 46.4 % (ref 34.8–46.6)
HEMOGLOBIN: 15.9 g/dL (ref 11.6–15.9)
LYMPH#: 1.9 10*3/uL (ref 0.9–3.3)
LYMPH%: 34.3 % (ref 14.0–48.0)
MCH: 29.4 pg (ref 26.0–34.0)
MCHC: 34.3 g/dL (ref 32.0–36.0)
MCV: 86 fL (ref 81–101)
MONO#: 0.3 10*3/uL (ref 0.1–0.9)
MONO%: 6.1 % (ref 0.0–13.0)
NEUT%: 53.3 % (ref 39.6–80.0)
NEUTROS ABS: 2.9 10*3/uL (ref 1.5–6.5)
Platelets: 234 10*3/uL (ref 145–400)
RBC: 5.4 10*6/uL — ABNORMAL HIGH (ref 3.70–5.32)
RDW: 12.9 % (ref 11.1–15.7)
WBC: 5.4 10*3/uL (ref 3.9–10.0)

## 2016-10-23 LAB — FERRITIN: Ferritin: 38 ng/ml (ref 9–269)

## 2016-10-23 MED ORDER — CYANOCOBALAMIN 1000 MCG/ML IJ SOLN
INTRAMUSCULAR | Status: AC
  Filled 2016-10-23: qty 1

## 2016-10-23 MED ORDER — CYANOCOBALAMIN 1000 MCG/ML IJ SOLN
1500.0000 ug | Freq: Once | INTRAMUSCULAR | Status: AC
  Administered 2016-10-23: 1500 ug via INTRAMUSCULAR

## 2016-10-23 NOTE — Patient Instructions (Signed)
Cyanocobalamin, Vitamin B12 injection What is this medicine? CYANOCOBALAMIN (sye an oh koe BAL a min) is a man made form of vitamin B12. Vitamin B12 is used in the growth of healthy blood cells, nerve cells, and proteins in the body. It also helps with the metabolism of fats and carbohydrates. This medicine is used to treat people who can not absorb vitamin B12. This medicine may be used for other purposes; ask your health care provider or pharmacist if you have questions. COMMON BRAND NAME(S): B-12 Compliance Kit, B-12 Injection Kit, Cyomin, LA-12, Nutri-Twelve, Physicians EZ Use B-12, Primabalt What should I tell my health care provider before I take this medicine? They need to know if you have any of these conditions: -kidney disease -Leber's disease -megaloblastic anemia -an unusual or allergic reaction to cyanocobalamin, cobalt, other medicines, foods, dyes, or preservatives -pregnant or trying to get pregnant -breast-feeding How should I use this medicine? This medicine is injected into a muscle or deeply under the skin. It is usually given by a health care professional in a clinic or doctor's office. However, your doctor may teach you how to inject yourself. Follow all instructions. Talk to your pediatrician regarding the use of this medicine in children. Special care may be needed. Overdosage: If you think you have taken too much of this medicine contact a poison control center or emergency room at once. NOTE: This medicine is only for you. Do not share this medicine with others. What if I miss a dose? If you are given your dose at a clinic or doctor's office, call to reschedule your appointment. If you give your own injections and you miss a dose, take it as soon as you can. If it is almost time for your next dose, take only that dose. Do not take double or extra doses. What may interact with this medicine? -colchicine -heavy alcohol intake This list may not describe all possible  interactions. Give your health care provider a list of all the medicines, herbs, non-prescription drugs, or dietary supplements you use. Also tell them if you smoke, drink alcohol, or use illegal drugs. Some items may interact with your medicine. What should I watch for while using this medicine? Visit your doctor or health care professional regularly. You may need blood work done while you are taking this medicine. You may need to follow a special diet. Talk to your doctor. Limit your alcohol intake and avoid smoking to get the best benefit. What side effects may I notice from receiving this medicine? Side effects that you should report to your doctor or health care professional as soon as possible: -allergic reactions like skin rash, itching or hives, swelling of the face, lips, or tongue -blue tint to skin -chest tightness, pain -difficulty breathing, wheezing -dizziness -red, swollen painful area on the leg Side effects that usually do not require medical attention (report to your doctor or health care professional if they continue or are bothersome): -diarrhea -headache This list may not describe all possible side effects. Call your doctor for medical advice about side effects. You may report side effects to FDA at 1-800-FDA-1088. Where should I keep my medicine? Keep out of the reach of children. Store at room temperature between 15 and 30 degrees C (59 and 85 degrees F). Protect from light. Throw away any unused medicine after the expiration date. NOTE: This sheet is a summary. It may not cover all possible information. If you have questions about this medicine, talk to your doctor, pharmacist, or   health care provider.  2018 Elsevier/Gold Standard (2007-07-11 22:10:20)  

## 2016-10-23 NOTE — Progress Notes (Signed)
Hematology and Oncology Follow Up Visit  CHRISHONDA Katherine Bradford [id number] 14-Jan-1961 56 y.o. 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 is here today for follow-up. She is doing well and has no complaints at this time. She would like to start doing her B 12 injections herself at home which we can certainly set up for her. She will get her injection today as planned and we will see how her counts look to determine if we can mover her injections to every 2 months.  Her iron studies have been stable for quite a while now.  No fever, chills, n/v, cough, rash, dizziness, SOB, chest pain, palpitations, abdominal pain or changes in bowel or bladder habits.  No swelling or tenderness in her extremities. Numbness or tingling in her extremities due to a pinched nerve in the cervical spine is unchanged.  She has maintained a good appetite and is staying well hydrated. Her weight is stable.  She does self skin checks at home, wears sunscreen and a hat outside. She states that she sees her dermatologist, Dr. Renda Rolls,  Every 6 months. She has a spot on her right upper lip that they are watching and will eventually have to be removed by a plastic surgeon.    ECOG Performance Status: 0 - Asymptomatic  Medications:  Allergies as of 10/23/2016      Reactions   Vancomycin    Red neck syndrome from IV Vanco.   Other    narcotics   Prednisone Other (See Comments)   Patient prefers not to take due to the edginess it gives her   Epinephrine Palpitations   Keflex [cephalexin] Rash      Medication List       Accurate as of 10/23/16  8:25 AM. Always use your most recent med list.          calcium carbonate 500 MG chewable  tablet Commonly known as:  TUMS - dosed in mg elemental calcium Chew 1 tablet by mouth daily.   calcium carbonate 600 MG Tabs tablet Commonly known as:  OS-CAL Take 1 tablet by mouth daily.   calcium carbonate 600 MG Tabs tablet Commonly known as:  OS-CAL Take by mouth.   cholecalciferol 1000 units tablet Commonly known as:  VITAMIN D Take 2,000 Units by mouth daily.   cyanocobalamin 1000 MCG/ML injection Commonly known as:  (VITAMIN B-12) Inject 1,000 mcg into the muscle every 30 (thirty) days.   folic acid 1 MG tablet Commonly known as:  FOLVITE Take 2 tablets (2 mg total) by mouth daily.   magnesium 30 MG tablet Take 30 mg by mouth 1 day or 1 dose.   SYNTHROID 75 MCG tablet Generic drug:  levothyroxine Take by mouth.   Vitamin C 500 MG Caps Take by mouth every morning.   vitamin E 100 UNIT capsule Take 100 Units by mouth daily.       Allergies:  Allergies  Allergen Reactions  . Vancomycin     Red neck syndrome from IV Vanco.  . Other     narcotics  . Prednisone Other (See Comments)    Patient prefers not to take due to the edginess it gives her  . Epinephrine Palpitations  . Keflex [Cephalexin] Rash    Past Medical  History, Surgical history, Social history, and Family History were reviewed and updated.  Review of Systems: All other 10 point review of systems is negative.   Physical Exam:  vitals were not taken for this visit.  Wt Readings from Last 3 Encounters:  07/03/16 167 lb 4 oz (75.9 kg)  04/10/16 167 lb 12.8 oz (76.1 kg)  01/03/16 169 lb 1.9 oz (76.7 kg)    Ocular: Sclerae unicteric, pupils equal, round and reactive to light Ear-nose-throat: Oropharynx clear, dentition fair Lymphatic: No cervical, supraclavicular or axillary adenopathy Lungs no rales or rhonchi, good excursion bilaterally Heart regular rate and rhythm, no murmur appreciated Abd soft, nontender, positive bowel sounds, no liver or spleen tip palpated on exam, no fluid  wave MSK no focal spinal tenderness, no joint edema Neuro: non-focal, well-oriented, appropriate affect Breasts: Deferred  Lab Results  Component Value Date   WBC 5.4 10/23/2016   HGB 15.9 10/23/2016   HCT 46.4 10/23/2016   MCV 86 10/23/2016   PLT 234 10/23/2016   Lab Results  Component Value Date   FERRITIN 45 07/03/2016   IRON 56 07/03/2016   TIBC 298 07/03/2016   UIBC 242 07/03/2016   IRONPCTSAT 19 (L) 07/03/2016   Lab Results  Component Value Date   RETICCTPCT 1.4 02/08/2015   RBC 5.40 (H) 10/23/2016   RETICCTABS 72.5 02/08/2015   No results found for: Nils Pyle, [facility] Lab Results  Component Value Date   IGGSERUM 1,200 10/05/2012   IGA 251 10/17/2012   IGA 267 10/17/2012   IGMSERUM 90 10/05/2012   No results found for: Odetta Pink, SPEI   Chemistry      Component Value Date/Time   NA 141 07/03/2016 0822   K 3.9 07/03/2016 0822   CL 105 04/10/2016 0812   CO2 26 07/03/2016 0822   BUN 17.1 07/03/2016 0822   CREATININE 0.8 07/03/2016 0822      Component Value Date/Time   CALCIUM 9.4 07/03/2016 0822   ALKPHOS 140 07/03/2016 0822   AST 18 07/03/2016 0822   ALT 21 07/03/2016 0822   BILITOT 0.38 07/03/2016 0822      Impression and Plan: Ms. Katherine Bradford is a very pleasant 56 yo caucasian female with pernicious anemia with intrinsic factor antibodies. She is doing well and has no complaints at this time.  She will get B 12 today. We will see what her counts show and look at moving her injections out to self inject every 2 months.  Her iron studies look good so no infusion needed at this time.  We will plan to see her back in 4 months for follow-up and lab.  She will contact our office with any questions or concerns. We can certainly see her sooner if need be.   Eliezer Bottom, NP 7/13/20188:25 AM

## 2016-10-24 LAB — VITAMIN B12: Vitamin B12: 834 pg/mL (ref 232–1245)

## 2016-10-27 ENCOUNTER — Other Ambulatory Visit: Payer: Self-pay | Admitting: Hematology & Oncology

## 2016-12-23 ENCOUNTER — Ambulatory Visit (HOSPITAL_BASED_OUTPATIENT_CLINIC_OR_DEPARTMENT_OTHER): Payer: BLUE CROSS/BLUE SHIELD

## 2016-12-23 VITALS — BP 119/58 | HR 76 | Temp 97.5°F | Resp 18

## 2016-12-23 DIAGNOSIS — D510 Vitamin B12 deficiency anemia due to intrinsic factor deficiency: Secondary | ICD-10-CM

## 2016-12-23 DIAGNOSIS — D509 Iron deficiency anemia, unspecified: Secondary | ICD-10-CM

## 2016-12-23 MED ORDER — CYANOCOBALAMIN 1000 MCG/ML IJ SOLN
1500.0000 ug | Freq: Once | INTRAMUSCULAR | Status: AC
  Administered 2016-12-23: 1500 ug via INTRAMUSCULAR

## 2016-12-23 MED ORDER — CYANOCOBALAMIN 1000 MCG/ML IJ SOLN
INTRAMUSCULAR | Status: AC
  Filled 2016-12-23: qty 2

## 2016-12-23 NOTE — Patient Instructions (Signed)
Cyanocobalamin, Vitamin B12 injection What is this medicine? CYANOCOBALAMIN (sye an oh koe BAL a min) is a man made form of vitamin B12. Vitamin B12 is used in the growth of healthy blood cells, nerve cells, and proteins in the body. It also helps with the metabolism of fats and carbohydrates. This medicine is used to treat people who can not absorb vitamin B12. This medicine may be used for other purposes; ask your health care provider or pharmacist if you have questions. COMMON BRAND NAME(S): B-12 Compliance Kit, B-12 Injection Kit, Cyomin, LA-12, Nutri-Twelve, Physicians EZ Use B-12, Primabalt What should I tell my health care provider before I take this medicine? They need to know if you have any of these conditions: -kidney disease -Leber's disease -megaloblastic anemia -an unusual or allergic reaction to cyanocobalamin, cobalt, other medicines, foods, dyes, or preservatives -pregnant or trying to get pregnant -breast-feeding How should I use this medicine? This medicine is injected into a muscle or deeply under the skin. It is usually given by a health care professional in a clinic or doctor's office. However, your doctor may teach you how to inject yourself. Follow all instructions. Talk to your pediatrician regarding the use of this medicine in children. Special care may be needed. Overdosage: If you think you have taken too much of this medicine contact a poison control center or emergency room at once. NOTE: This medicine is only for you. Do not share this medicine with others. What if I miss a dose? If you are given your dose at a clinic or doctor's office, call to reschedule your appointment. If you give your own injections and you miss a dose, take it as soon as you can. If it is almost time for your next dose, take only that dose. Do not take double or extra doses. What may interact with this medicine? -colchicine -heavy alcohol intake This list may not describe all possible  interactions. Give your health care provider a list of all the medicines, herbs, non-prescription drugs, or dietary supplements you use. Also tell them if you smoke, drink alcohol, or use illegal drugs. Some items may interact with your medicine. What should I watch for while using this medicine? Visit your doctor or health care professional regularly. You may need blood work done while you are taking this medicine. You may need to follow a special diet. Talk to your doctor. Limit your alcohol intake and avoid smoking to get the best benefit. What side effects may I notice from receiving this medicine? Side effects that you should report to your doctor or health care professional as soon as possible: -allergic reactions like skin rash, itching or hives, swelling of the face, lips, or tongue -blue tint to skin -chest tightness, pain -difficulty breathing, wheezing -dizziness -red, swollen painful area on the leg Side effects that usually do not require medical attention (report to your doctor or health care professional if they continue or are bothersome): -diarrhea -headache This list may not describe all possible side effects. Call your doctor for medical advice about side effects. You may report side effects to FDA at 1-800-FDA-1088. Where should I keep my medicine? Keep out of the reach of children. Store at room temperature between 15 and 30 degrees C (59 and 85 degrees F). Protect from light. Throw away any unused medicine after the expiration date. NOTE: This sheet is a summary. It may not cover all possible information. If you have questions about this medicine, talk to your doctor, pharmacist, or   health care provider.  2018 Elsevier/Gold Standard (2007-07-11 22:10:20)  

## 2016-12-25 ENCOUNTER — Other Ambulatory Visit: Payer: BLUE CROSS/BLUE SHIELD

## 2016-12-25 ENCOUNTER — Ambulatory Visit: Payer: BLUE CROSS/BLUE SHIELD

## 2017-01-08 DIAGNOSIS — H524 Presbyopia: Secondary | ICD-10-CM | POA: Diagnosis not present

## 2017-01-08 DIAGNOSIS — H52222 Regular astigmatism, left eye: Secondary | ICD-10-CM | POA: Diagnosis not present

## 2017-01-08 DIAGNOSIS — H5202 Hypermetropia, left eye: Secondary | ICD-10-CM | POA: Diagnosis not present

## 2017-01-08 DIAGNOSIS — H5201 Hypermetropia, right eye: Secondary | ICD-10-CM | POA: Diagnosis not present

## 2017-02-01 DIAGNOSIS — M8580 Other specified disorders of bone density and structure, unspecified site: Secondary | ICD-10-CM | POA: Diagnosis not present

## 2017-02-01 DIAGNOSIS — E890 Postprocedural hypothyroidism: Secondary | ICD-10-CM | POA: Diagnosis not present

## 2017-02-18 ENCOUNTER — Telehealth: Payer: Self-pay | Admitting: Hematology & Oncology

## 2017-02-18 NOTE — Telephone Encounter (Signed)
lmom to inform pt of r/s 11/16 appts to 11/13 at 115 pm per Judson Roch

## 2017-02-23 ENCOUNTER — Ambulatory Visit: Payer: BLUE CROSS/BLUE SHIELD

## 2017-02-23 ENCOUNTER — Other Ambulatory Visit: Payer: BLUE CROSS/BLUE SHIELD

## 2017-02-23 ENCOUNTER — Ambulatory Visit: Payer: BLUE CROSS/BLUE SHIELD | Admitting: Family

## 2017-02-26 ENCOUNTER — Ambulatory Visit: Payer: BLUE CROSS/BLUE SHIELD | Admitting: Family

## 2017-02-26 ENCOUNTER — Ambulatory Visit: Payer: BLUE CROSS/BLUE SHIELD

## 2017-02-26 ENCOUNTER — Other Ambulatory Visit: Payer: BLUE CROSS/BLUE SHIELD

## 2017-03-03 ENCOUNTER — Other Ambulatory Visit (HOSPITAL_BASED_OUTPATIENT_CLINIC_OR_DEPARTMENT_OTHER): Payer: BLUE CROSS/BLUE SHIELD

## 2017-03-03 ENCOUNTER — Ambulatory Visit (HOSPITAL_BASED_OUTPATIENT_CLINIC_OR_DEPARTMENT_OTHER): Payer: BLUE CROSS/BLUE SHIELD | Admitting: Hematology & Oncology

## 2017-03-03 ENCOUNTER — Other Ambulatory Visit: Payer: Self-pay

## 2017-03-03 ENCOUNTER — Ambulatory Visit (HOSPITAL_BASED_OUTPATIENT_CLINIC_OR_DEPARTMENT_OTHER): Payer: BLUE CROSS/BLUE SHIELD

## 2017-03-03 VITALS — BP 116/55 | HR 71 | Temp 97.6°F | Resp 20 | Wt 172.8 lb

## 2017-03-03 DIAGNOSIS — E538 Deficiency of other specified B group vitamins: Secondary | ICD-10-CM

## 2017-03-03 DIAGNOSIS — D510 Vitamin B12 deficiency anemia due to intrinsic factor deficiency: Secondary | ICD-10-CM

## 2017-03-03 DIAGNOSIS — D508 Other iron deficiency anemias: Secondary | ICD-10-CM | POA: Diagnosis not present

## 2017-03-03 DIAGNOSIS — G629 Polyneuropathy, unspecified: Secondary | ICD-10-CM | POA: Diagnosis not present

## 2017-03-03 DIAGNOSIS — D500 Iron deficiency anemia secondary to blood loss (chronic): Secondary | ICD-10-CM

## 2017-03-03 DIAGNOSIS — K2940 Chronic atrophic gastritis without bleeding: Secondary | ICD-10-CM | POA: Diagnosis not present

## 2017-03-03 DIAGNOSIS — K909 Intestinal malabsorption, unspecified: Secondary | ICD-10-CM | POA: Diagnosis not present

## 2017-03-03 DIAGNOSIS — Z85820 Personal history of malignant melanoma of skin: Secondary | ICD-10-CM | POA: Diagnosis not present

## 2017-03-03 DIAGNOSIS — D509 Iron deficiency anemia, unspecified: Secondary | ICD-10-CM

## 2017-03-03 LAB — CMP (CANCER CENTER ONLY)
ALK PHOS: 112 U/L — AB (ref 26–84)
ALT: 24 U/L (ref 10–47)
AST: 21 U/L (ref 11–38)
Albumin: 3.7 g/dL (ref 3.3–5.5)
BILIRUBIN TOTAL: 0.6 mg/dL (ref 0.20–1.60)
BUN: 13 mg/dL (ref 7–22)
CALCIUM: 9.9 mg/dL (ref 8.0–10.3)
CO2: 26 meq/L (ref 18–33)
Chloride: 104 mEq/L (ref 98–108)
Creat: 1 mg/dl (ref 0.6–1.2)
GLUCOSE: 90 mg/dL (ref 73–118)
POTASSIUM: 3.6 meq/L (ref 3.3–4.7)
Sodium: 150 mEq/L — ABNORMAL HIGH (ref 128–145)
Total Protein: 7.4 g/dL (ref 6.4–8.1)

## 2017-03-03 LAB — CBC WITH DIFFERENTIAL (CANCER CENTER ONLY)
BASO#: 0 10*3/uL (ref 0.0–0.2)
BASO%: 0.6 % (ref 0.0–2.0)
EOS%: 6 % (ref 0.0–7.0)
Eosinophils Absolute: 0.3 10*3/uL (ref 0.0–0.5)
HCT: 46.2 % (ref 34.8–46.6)
HGB: 15.5 g/dL (ref 11.6–15.9)
LYMPH#: 1.7 10*3/uL (ref 0.9–3.3)
LYMPH%: 35.7 % (ref 14.0–48.0)
MCH: 29.4 pg (ref 26.0–34.0)
MCHC: 33.5 g/dL (ref 32.0–36.0)
MCV: 88 fL (ref 81–101)
MONO#: 0.3 10*3/uL (ref 0.1–0.9)
MONO%: 7.2 % (ref 0.0–13.0)
NEUT#: 2.4 10*3/uL (ref 1.5–6.5)
NEUT%: 50.5 % (ref 39.6–80.0)
PLATELETS: 248 10*3/uL (ref 145–400)
RBC: 5.28 10*6/uL (ref 3.70–5.32)
RDW: 13.1 % (ref 11.1–15.7)
WBC: 4.7 10*3/uL (ref 3.9–10.0)

## 2017-03-03 LAB — IRON AND TIBC
%SAT: 24 % (ref 21–57)
Iron: 78 ug/dL (ref 41–142)
TIBC: 318 ug/dL (ref 236–444)
UIBC: 240 ug/dL (ref 120–384)

## 2017-03-03 LAB — FERRITIN: Ferritin: 29 ng/ml (ref 9–269)

## 2017-03-03 MED ORDER — CYANOCOBALAMIN 1000 MCG/ML IJ SOLN
INTRAMUSCULAR | Status: AC
  Filled 2017-03-03: qty 1

## 2017-03-03 MED ORDER — CYANOCOBALAMIN 1000 MCG/ML IJ SOLN
1500.0000 ug | Freq: Once | INTRAMUSCULAR | Status: AC
  Administered 2017-03-03: 1500 ug via INTRAMUSCULAR

## 2017-03-03 NOTE — Progress Notes (Signed)
Hematology and Oncology Follow Up Visit  Katherine Bradford [id number] 1960/06/06 56 y.o. 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 She is doing quite well. 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 However, this is not stopping her from work. 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  She actually is working today.  We last saw her back in July, her iron studies showed a ferritin of 38 with an iron saturation of 24%.  I think she last got iron back in March.  She has had no problems with rashes.  She has had no change in bowel or bladder habits.  She had no cough.  She had no nausea or vomiting.  She had no leg swelling.  She has had no fever.  Overall, her performance status is ECOG 0.  Medications:  Current Outpatient Prescriptions:  .  Ascorbic Acid (VITAMIN C) 500 MG CAPS, Take by mouth every morning., Disp: , Rfl:  .  calcium carbonate (OS-CAL) 600 MG TABS tablet, Take 1 tablet by mouth daily., Disp: , Rfl:  .  calcium carbonate (OS-CAL) 600 MG TABS tablet, Take by mouth., Disp: , Rfl:  .  calcium carbonate (TUMS - DOSED IN MG ELEMENTAL CALCIUM) 500 MG chewable tablet, Chew 1 tablet by mouth daily., Disp: , Rfl:  .  cholecalciferol (VITAMIN D) 1000 UNITS tablet, Take 2,000 Units by mouth daily., Disp: , Rfl:  .  cyanocobalamin (,VITAMIN B-12,) 1000 MCG/ML injection, Inject 1,000 mcg into the muscle every 30 (thirty) days., Disp: , Rfl:  .  folic acid (FOLVITE) 1 MG tablet, Take 2 tablets (2 mg total) by mouth daily., Disp: 180 tablet, Rfl: 3 .  levothyroxine (SYNTHROID) 75 MCG tablet, Take by mouth., Disp: , Rfl:  .  magnesium 30 MG  tablet, Take 30 mg by mouth 1 day or 1 dose., Disp: , Rfl:  .  vitamin E 100 UNIT capsule, Take 100 Units by mouth daily. , Disp: , Rfl:   Allergies:  Allergies  Allergen Reactions  . Vancomycin     Red neck syndrome from IV Vanco.  . Other     narcotics  . Prednisone Other (See Comments)    Patient prefers not to take due to the edginess it gives her  . Epinephrine Palpitations  . Keflex [Cephalexin] Rash    Past Medical History, Surgical history, Social history, and Family History were reviewed and updated.  Review of Systems: As stated in the interim history  Physical Exam:  weight is 167 lb 12.8 oz (76.1 kg). Her oral temperature is 98.1 F (36.7 C). Her blood pressure is 119/83 and her pulse is 84. Her respiration is 18 and oxygen saturation is 99%.   Wt Readings from Last 3 Encounters:  04/10/16 167 lb 12.8 oz (76.1 kg)  01/03/16 169 lb 1.9 oz (76.7 kg)  08/30/15 171 lb (77.6 kg)     I examined Katherine Bradford.  The findings on my examination are noted below with appropriate changes:   Well-developed well-nourished white female in no obvious distress. Head and neck exam shows no ocular or oral lesions. She has no palpable  cervical or supraclavicular lymph nodes. Lungs are clear. Cardiac exam regular rate and rhythm with no murmurs, rubs or bruits. Abdomen is soft. She has good bowel sounds. There is no fluid wave. There is no palpable liver or spleen tip. Extremities shows no clubbing, cyanosis or edema. She has a wide local excision scar in the right inner thigh. This is well-healed. Neurological exam shows no focal neurological deficits. Back exam shows no tenderness over the spine, ribs or hips. Skin exam shows some areas of eczema.   Lab Results  Component Value Date   WBC 4.9 04/10/2016   HGB 15.5 04/10/2016   HCT 45.5 04/10/2016   MCV 86 04/10/2016   PLT 218 04/10/2016     Chemistry      Component Value Date/Time   NA 139 05/03/2015 0827   K 4.1 05/03/2015 0827     CL 105 11/09/2014 0859   CO2 26 05/03/2015 0827   BUN 12.7 05/03/2015 0827   CREATININE 1.0 05/03/2015 0827      Component Value Date/Time   CALCIUM 9.4 05/03/2015 0827   ALKPHOS 93 05/03/2015 0827   AST 17 05/03/2015 0827   ALT 17 05/03/2015 0827   BILITOT 0.46 05/03/2015 0827         Impression and Plan: Katherine Bradford Is a 53 much better [age] year-old white female. She has pernicious anemia. She has intrinsic factor antibodies.  We will have to see what her iron levels are.  Her MCV is a little bit better so I would not think that there will be a problem.  We will continue to have her come back monthly for her vitamin B-12.  She does require a higher level of vitamin B12.  I think we can move her appointments to see [place] in 4 months now.   Volanda Napoleon, MD  12/29/20178:58 AM

## 2017-03-03 NOTE — Patient Instructions (Signed)

## 2017-03-04 LAB — VITAMIN B12: Vitamin B12: 442 pg/mL (ref 232–1245)

## 2017-03-09 DIAGNOSIS — D485 Neoplasm of uncertain behavior of skin: Secondary | ICD-10-CM | POA: Diagnosis not present

## 2017-05-07 ENCOUNTER — Inpatient Hospital Stay: Payer: BLUE CROSS/BLUE SHIELD | Attending: Hematology & Oncology

## 2017-05-07 ENCOUNTER — Other Ambulatory Visit: Payer: Self-pay | Admitting: *Deleted

## 2017-05-07 VITALS — BP 92/78 | HR 8 | Temp 98.0°F | Resp 18

## 2017-05-07 DIAGNOSIS — D510 Vitamin B12 deficiency anemia due to intrinsic factor deficiency: Secondary | ICD-10-CM | POA: Insufficient documentation

## 2017-05-07 DIAGNOSIS — D509 Iron deficiency anemia, unspecified: Secondary | ICD-10-CM

## 2017-05-07 MED ORDER — CYANOCOBALAMIN 1000 MCG/ML IJ SOLN
1500.0000 ug | Freq: Once | INTRAMUSCULAR | Status: AC
  Administered 2017-05-07: 1500 ug via INTRAMUSCULAR

## 2017-05-07 MED ORDER — CYANOCOBALAMIN 1000 MCG/ML IJ SOLN: INTRAMUSCULAR | 6 refills | Status: DC

## 2017-05-07 NOTE — Patient Instructions (Signed)
Cyanocobalamin, Vitamin B12 injection What is this medicine? CYANOCOBALAMIN (sye an oh koe BAL a min) is a man made form of vitamin B12. Vitamin B12 is used in the growth of healthy blood cells, nerve cells, and proteins in the body. It also helps with the metabolism of fats and carbohydrates. This medicine is used to treat people who can not absorb vitamin B12. This medicine may be used for other purposes; ask your health care provider or pharmacist if you have questions. COMMON BRAND NAME(S): B-12 Compliance Kit, B-12 Injection Kit, Cyomin, LA-12, Nutri-Twelve, Physicians EZ Use B-12, Primabalt What should I tell my health care provider before I take this medicine? They need to know if you have any of these conditions: -kidney disease -Leber's disease -megaloblastic anemia -an unusual or allergic reaction to cyanocobalamin, cobalt, other medicines, foods, dyes, or preservatives -pregnant or trying to get pregnant -breast-feeding How should I use this medicine? This medicine is injected into a muscle or deeply under the skin. It is usually given by a health care professional in a clinic or doctor's office. However, your doctor may teach you how to inject yourself. Follow all instructions. Talk to your pediatrician regarding the use of this medicine in children. Special care may be needed. Overdosage: If you think you have taken too much of this medicine contact a poison control center or emergency room at once. NOTE: This medicine is only for you. Do not share this medicine with others. What if I miss a dose? If you are given your dose at a clinic or doctor's office, call to reschedule your appointment. If you give your own injections and you miss a dose, take it as soon as you can. If it is almost time for your next dose, take only that dose. Do not take double or extra doses. What may interact with this medicine? -colchicine -heavy alcohol intake This list may not describe all possible  interactions. Give your health care provider a list of all the medicines, herbs, non-prescription drugs, or dietary supplements you use. Also tell them if you smoke, drink alcohol, or use illegal drugs. Some items may interact with your medicine. What should I watch for while using this medicine? Visit your doctor or health care professional regularly. You may need blood work done while you are taking this medicine. You may need to follow a special diet. Talk to your doctor. Limit your alcohol intake and avoid smoking to get the best benefit. What side effects may I notice from receiving this medicine? Side effects that you should report to your doctor or health care professional as soon as possible: -allergic reactions like skin rash, itching or hives, swelling of the face, lips, or tongue -blue tint to skin -chest tightness, pain -difficulty breathing, wheezing -dizziness -red, swollen painful area on the leg Side effects that usually do not require medical attention (report to your doctor or health care professional if they continue or are bothersome): -diarrhea -headache This list may not describe all possible side effects. Call your doctor for medical advice about side effects. You may report side effects to FDA at 1-800-FDA-1088. Where should I keep my medicine? Keep out of the reach of children. Store at room temperature between 15 and 30 degrees C (59 and 85 degrees F). Protect from light. Throw away any unused medicine after the expiration date. NOTE: This sheet is a summary. It may not cover all possible information. If you have questions about this medicine, talk to your doctor, pharmacist, or   health care provider.  2018 Elsevier/Gold Standard (2007-07-11 22:10:20)  

## 2017-07-09 ENCOUNTER — Inpatient Hospital Stay: Payer: BLUE CROSS/BLUE SHIELD | Attending: Hematology & Oncology | Admitting: Hematology & Oncology

## 2017-07-09 ENCOUNTER — Inpatient Hospital Stay: Payer: BLUE CROSS/BLUE SHIELD

## 2017-07-09 DIAGNOSIS — H2513 Age-related nuclear cataract, bilateral: Secondary | ICD-10-CM | POA: Diagnosis not present

## 2017-07-09 DIAGNOSIS — H04123 Dry eye syndrome of bilateral lacrimal glands: Secondary | ICD-10-CM | POA: Diagnosis not present

## 2017-07-09 DIAGNOSIS — D4981 Neoplasm of unspecified behavior of retina and choroid: Secondary | ICD-10-CM | POA: Diagnosis not present

## 2017-07-27 DIAGNOSIS — M8580 Other specified disorders of bone density and structure, unspecified site: Secondary | ICD-10-CM | POA: Diagnosis not present

## 2017-07-27 DIAGNOSIS — E890 Postprocedural hypothyroidism: Secondary | ICD-10-CM | POA: Diagnosis not present

## 2017-08-04 ENCOUNTER — Encounter: Payer: Self-pay | Admitting: Family Medicine

## 2017-08-04 DIAGNOSIS — Z6826 Body mass index (BMI) 26.0-26.9, adult: Secondary | ICD-10-CM | POA: Diagnosis not present

## 2017-08-04 DIAGNOSIS — Z01419 Encounter for gynecological examination (general) (routine) without abnormal findings: Secondary | ICD-10-CM | POA: Diagnosis not present

## 2017-08-18 ENCOUNTER — Encounter: Payer: Self-pay | Admitting: Family Medicine

## 2017-08-18 DIAGNOSIS — Z1231 Encounter for screening mammogram for malignant neoplasm of breast: Secondary | ICD-10-CM | POA: Diagnosis not present

## 2017-08-18 DIAGNOSIS — Z1329 Encounter for screening for other suspected endocrine disorder: Secondary | ICD-10-CM | POA: Diagnosis not present

## 2017-08-18 DIAGNOSIS — E785 Hyperlipidemia, unspecified: Secondary | ICD-10-CM | POA: Diagnosis not present

## 2017-08-18 DIAGNOSIS — Z1321 Encounter for screening for nutritional disorder: Secondary | ICD-10-CM | POA: Diagnosis not present

## 2017-08-18 DIAGNOSIS — Z13820 Encounter for screening for osteoporosis: Secondary | ICD-10-CM | POA: Diagnosis not present

## 2017-08-18 DIAGNOSIS — Z13228 Encounter for screening for other metabolic disorders: Secondary | ICD-10-CM | POA: Diagnosis not present

## 2017-08-19 ENCOUNTER — Other Ambulatory Visit: Payer: Self-pay | Admitting: Obstetrics and Gynecology

## 2017-08-19 DIAGNOSIS — R928 Other abnormal and inconclusive findings on diagnostic imaging of breast: Secondary | ICD-10-CM

## 2017-08-23 ENCOUNTER — Other Ambulatory Visit: Payer: Self-pay | Admitting: *Deleted

## 2017-08-23 MED ORDER — CYANOCOBALAMIN 1000 MCG/ML IJ SOLN: INTRAMUSCULAR | 6 refills | Status: DC

## 2017-08-27 ENCOUNTER — Ambulatory Visit
Admission: RE | Admit: 2017-08-27 | Discharge: 2017-08-27 | Disposition: A | Discharge status: Home / Self Care | Payer: BLUE CROSS/BLUE SHIELD | Source: Ambulatory Visit | Attending: Obstetrics and Gynecology | Admitting: Obstetrics and Gynecology

## 2017-08-27 DIAGNOSIS — R928 Other abnormal and inconclusive findings on diagnostic imaging of breast: Secondary | ICD-10-CM

## 2017-08-27 DIAGNOSIS — N6489 Other specified disorders of breast: Secondary | ICD-10-CM | POA: Diagnosis not present

## 2017-10-29 ENCOUNTER — Ambulatory Visit: Payer: BLUE CROSS/BLUE SHIELD | Admitting: Family Medicine

## 2017-11-03 ENCOUNTER — Ambulatory Visit (INDEPENDENT_AMBULATORY_CARE_PROVIDER_SITE_OTHER): Payer: BLUE CROSS/BLUE SHIELD | Admitting: Family Medicine

## 2017-11-03 ENCOUNTER — Encounter: Payer: Self-pay | Admitting: Family Medicine

## 2017-11-03 VITALS — BP 108/70 | HR 101 | Temp 98.8°F | Ht 66.0 in | Wt 168.2 lb

## 2017-11-03 DIAGNOSIS — E782 Mixed hyperlipidemia: Secondary | ICD-10-CM | POA: Diagnosis not present

## 2017-11-03 NOTE — Progress Notes (Signed)
Chief Complaint  Patient presents with  . New Patient (Initial Visit)       New Patient Visit SUBJECTIVE: HPI: Katherine Bradford is an 57 y.o.female who is being seen for establishing care.   The patient was previously seen at GYN.   Dx'd w high cholesterol. Not on any meds. Goes to [employer] and does aerobics, some lifting.  No Cp or SOB. Diet is healthy overall.  Unsure of exact numbers. Has been drinking lots of milk and eating yogurt to help with osteopenia. No famhx of high cholesterol. Denies CP or SOB.  Allergies  Allergen Reactions  . Vancomycin     Red neck syndrome from IV Vanco.  . Other     narcotics  . Prednisone Other (See Comments)    Patient prefers not to take due to the edginess it gives her  . Epinephrine Palpitations  . Keflex [Cephalexin] Rash  . Levothyroxine Rash   Past Medical History:  Diagnosis Date  . Anemia   . B12 deficiency   . Graves disease   . Iron deficiency anemia, unspecified 12/27/2012  . Numbness and tingling in hands   . Weakness    Past Surgical History:  Procedure Laterality Date  . None     Family History  Problem Relation Age of Onset  . High blood pressure Mother   . Osteoarthritis Mother   . Thyroid disease Mother   . Heart failure Father   . Heart disease Father   . Diabetes type II Brother   . Colon cancer Neg Hx    Allergies  Allergen Reactions  . Vancomycin     Red neck syndrome from IV Vanco.  . Other     narcotics  . Prednisone Other (See Comments)    Patient prefers not to take due to the edginess it gives her  . Epinephrine Palpitations  . Keflex [Cephalexin] Rash  . Levothyroxine Rash    Current Outpatient Medications:  .  Ascorbic Acid (VITAMIN C) 500 MG CAPS, Take by mouth every morning., Disp: , Rfl:  .  cholecalciferol (VITAMIN D) 1000 UNITS tablet, Take 2,000 Units by mouth daily., Disp: , Rfl:  .  folic acid (FOLVITE) 1 MG tablet, TAKE TWO TABLETS BY MOUTH ONCE DAILY, Disp: 180 tablet, Rfl: 3 .   levothyroxine (SYNTHROID, LEVOTHROID) 75 MCG tablet, Take 75 mcg by mouth daily before breakfast., Disp: , Rfl:  .  magnesium 30 MG tablet, Take 30 mg by mouth 1 day or 1 dose., Disp: , Rfl:  .  vitamin E 100 UNIT capsule, Take 100 Units by mouth daily. , Disp: , Rfl:   Patient's last menstrual period was 08/26/2012.  ROS Cardiovascular: Denies chest pain  Respiratory: Denies dyspnea   OBJECTIVE: BP 108/70 (BP Location: Left Arm, Patient Position: Sitting, Cuff Size: Normal)   Pulse (!) 101   Temp 98.8 F (37.1 C) (Oral)   Ht 5\' 6"  (1.676 m)   Wt 168 lb 4 oz (76.3 kg)   LMP 08/26/2012   SpO2 95%   BMI 27.16 kg/m   Constitutional: -  VS reviewed -  Well developed, well nourished, appears stated age -  No apparent distress  Psychiatric: -  Oriented to person, place, and time -  Memory intact -  Affect and mood normal -  Fluent conversation, good eye contact -  Judgment and insight age appropriate  Eye: -  Conjunctivae clear, no discharge -  Pupils symmetric, round, reactive to light  ENMT: -  MMM    Pharynx moist, no exudate, no erythema  Neck: -  No gross swelling, no palpable masses -  Thyroid midline, not enlarged, mobile, no palpable masses  Cardiovascular: -  RRR on my exam -  No bruits -  No LE edema  Respiratory: -  Normal respiratory effort, no accessory muscle use, no retraction -  Breath sounds equal, no wheezes, no ronchi, no crackles  Musculoskeletal: -  No clubbing, no cyanosis -  Gait normal  Skin: -  No significant lesion on inspection -  Warm and dry to palpation   ASSESSMENT/PLAN: Mixed hyperlipidemia  Patient instructed to sign release of records form from her previous PCP. I was able to review records. Mild mixed hyperlipidemia. Will await 3 mo of lifestyle changes and re-evaluate per her request. Counseled on diet and exercise. Healthy diet handout given. Patient should return in 3 mo. The patient voiced understanding and agreement to the  plan.   [place], DO 11/03/17  4:46 PM

## 2017-11-03 NOTE — Patient Instructions (Addendum)
I would like to review your labs before starting a medication. If anything comes back where I think it is dangerous, I will let you know.   Aim to do some physical exertion for 150 minutes per week. This is typically divided into 5 days per week, 30 minutes per day. The activity should be enough to get your heart rate up. Anything is better than nothing if you have time constraints.  Lifting weights can help with your metabolism and strength.  Healthy Eating Plan Many factors influence your heart health, including eating and exercise habits. Heart (coronary) risk increases with abnormal blood fat (lipid) levels. Heart-healthy meal planning includes limiting unhealthy fats, increasing healthy fats, and making other small dietary changes. This includes maintaining a healthy body weight to help keep lipid levels within a normal range.  WHAT IS MY PLAN?  Your health care provider recommends that you:  Drink a glass of water before meals to help with satiety.  Eat slowly.  An alternative to the water is to add Metamucil. This will help with satiety as well. It does contain calories, unlike water.  WHAT TYPES OF FAT SHOULD I CHOOSE?  Choose healthy fats more often. Choose monounsaturated and polyunsaturated fats, such as olive oil and canola oil, flaxseeds, walnuts, almonds, and seeds.  Eat more omega-3 fats. Good choices include salmon, mackerel, sardines, tuna, flaxseed oil, and ground flaxseeds. Aim to eat fish at least two times each week.  Avoid foods with partially hydrogenated oils in them. These contain trans fats. Examples of foods that contain trans fats are stick margarine, some tub margarines, cookies, crackers, and other baked goods. If you are going to avoid a fat, this is the one to avoid!  WHAT GENERAL GUIDELINES DO I NEED TO FOLLOW?  Check food labels carefully to identify foods with trans fats. Avoid these types of options when possible.  Fill one half of your plate with  vegetables and green salads. Eat 4-5 servings of vegetables per day. A serving of vegetables equals 1 cup of raw leafy vegetables,  cup of raw or cooked cut-up vegetables, or  cup of vegetable juice.  Fill one fourth of your plate with whole grains. Look for the word "whole" as the first word in the ingredient list.  Fill one fourth of your plate with lean protein foods.  Eat 4-5 servings of fruit per day. A serving of fruit equals one medium whole fruit,  cup of dried fruit,  cup of fresh, frozen, or canned fruit. Try to avoid fruits in cups/syrups as the sugar content can be high.  Eat more foods that contain soluble fiber. Examples of foods that contain this type of fiber are apples, broccoli, carrots, beans, peas, and barley. Aim to get 20-30 g of fiber per day.  Eat more home-cooked food and less restaurant, buffet, and fast food.  Limit or avoid alcohol.  Limit foods that are high in starch and sugar.  Avoid fried foods when able.  Cook foods by using methods other than frying. Baking, boiling, grilling, and broiling are all great options. Other fat-reducing suggestions include: ? Removing the skin from poultry. ? Removing all visible fats from meats. ? Skimming the fat off of stews, soups, and gravies before serving them. ? Steaming vegetables in water or broth.  Lose weight if you are overweight. Losing just 5-10% of your initial body weight can help your overall health and prevent diseases such as diabetes and heart disease.  Increase your consumption of  nuts, legumes, and seeds to 4-5 servings per week. One serving of dried beans or legumes equals  cup after being cooked, one serving of nuts equals 1 ounces, and one serving of seeds equals  ounce or 1 tablespoon.  WHAT ARE GOOD FOODS CAN I EAT? Grains Grainy breads (try to find bread that is 3 g of fiber per slice or greater), oatmeal, light popcorn. Whole-grain cereals. Rice and pasta, including brown rice and those  that are made with whole wheat. Edamame pasta is a great alternative to grain pasta. It has a higher protein content. Try to avoid significant consumption of white bread, sugary cereals, or pastries/baked goods.  Vegetables All vegetables. Cooked white potatoes do not count as vegetables.  Fruits All fruits, but limit pineapple and bananas as these fruits have a higher sugar content.  Meats and Other Protein Sources Lean, well-trimmed beef, veal, pork, and lamb. Chicken and [place] without skin. All fish and shellfish. Wild duck, rabbit, pheasant, and venison. Egg whites or low-cholesterol egg substitutes. Dried beans, peas, lentils, and tofu.Seeds and most nuts.  Dairy Low-fat or nonfat cheeses, including ricotta, string, and mozzarella. Skim or 1% milk that is liquid, powdered, or evaporated. Buttermilk that is made with low-fat milk. Nonfat or low-fat yogurt. Soy/Almond milk are good alternatives if you cannot handle dairy.  Beverages Water is the best for you. Sports drinks with less sugar are more desirable unless you are a highly active athlete.  Sweets and Desserts Sherbets and fruit ices. Honey, jam, marmalade, jelly, and syrups. Dark chocolate.  Eat all sweets and desserts in moderation.  Fats and Oils Nonhydrogenated (trans-free) margarines. Vegetable oils, including soybean, sesame, sunflower, olive, peanut, safflower, corn, canola, and cottonseed. Salad dressings or mayonnaise that are made with a vegetable oil. Limit added fats and oils that you use for cooking, baking, salads, and as spreads.  Other Cocoa powder. Coffee and tea. Most condiments.  The items listed above may not be a complete list of recommended foods or beverages. Contact your dietitian for more options.

## 2017-11-03 NOTE — Progress Notes (Signed)
Pre visit review using our clinic review tool, if applicable. No additional management support is needed unless otherwise documented below in the visit note. 

## 2017-11-04 ENCOUNTER — Encounter: Payer: Self-pay | Admitting: Family Medicine

## 2017-11-11 ENCOUNTER — Encounter: Payer: Self-pay | Admitting: Gastroenterology

## 2018-01-11 ENCOUNTER — Other Ambulatory Visit: Payer: Self-pay | Admitting: Hematology & Oncology

## 2018-02-02 DIAGNOSIS — H04123 Dry eye syndrome of bilateral lacrimal glands: Secondary | ICD-10-CM | POA: Diagnosis not present

## 2018-02-02 DIAGNOSIS — H2513 Age-related nuclear cataract, bilateral: Secondary | ICD-10-CM | POA: Diagnosis not present

## 2018-02-02 DIAGNOSIS — D4981 Neoplasm of unspecified behavior of retina and choroid: Secondary | ICD-10-CM | POA: Diagnosis not present

## 2018-02-04 ENCOUNTER — Ambulatory Visit: Payer: BLUE CROSS/BLUE SHIELD | Admitting: Family Medicine

## 2018-04-22 DIAGNOSIS — E890 Postprocedural hypothyroidism: Secondary | ICD-10-CM | POA: Diagnosis not present

## 2018-04-22 DIAGNOSIS — M8580 Other specified disorders of bone density and structure, unspecified site: Secondary | ICD-10-CM | POA: Diagnosis not present

## 2018-12-05 ENCOUNTER — Other Ambulatory Visit: Payer: Self-pay | Admitting: Obstetrics and Gynecology

## 2018-12-05 DIAGNOSIS — Z1231 Encounter for screening mammogram for malignant neoplasm of breast: Secondary | ICD-10-CM

## 2018-12-21 DIAGNOSIS — D225 Melanocytic nevi of trunk: Secondary | ICD-10-CM | POA: Diagnosis not present

## 2018-12-21 DIAGNOSIS — L821 Other seborrheic keratosis: Secondary | ICD-10-CM | POA: Diagnosis not present

## 2018-12-21 DIAGNOSIS — Z85820 Personal history of malignant melanoma of skin: Secondary | ICD-10-CM | POA: Diagnosis not present

## 2018-12-21 DIAGNOSIS — Z86018 Personal history of other benign neoplasm: Secondary | ICD-10-CM | POA: Diagnosis not present

## 2019-01-27 ENCOUNTER — Other Ambulatory Visit: Payer: Self-pay | Admitting: Hematology & Oncology

## 2019-01-30 ENCOUNTER — Ambulatory Visit: Payer: BLUE CROSS/BLUE SHIELD

## 2019-03-01 ENCOUNTER — Ambulatory Visit
Admission: RE | Admit: 2019-03-01 | Discharge: 2019-03-01 | Disposition: A | Discharge status: Home / Self Care | Payer: BC Managed Care – PPO | Source: Ambulatory Visit | Attending: Obstetrics and Gynecology | Admitting: Obstetrics and Gynecology

## 2019-03-01 ENCOUNTER — Other Ambulatory Visit: Payer: Self-pay

## 2019-03-01 DIAGNOSIS — Z1231 Encounter for screening mammogram for malignant neoplasm of breast: Secondary | ICD-10-CM

## 2019-03-24 ENCOUNTER — Other Ambulatory Visit: Payer: Self-pay | Admitting: Hematology & Oncology

## 2019-04-18 DIAGNOSIS — H2513 Age-related nuclear cataract, bilateral: Secondary | ICD-10-CM | POA: Diagnosis not present

## 2019-04-18 DIAGNOSIS — D4981 Neoplasm of unspecified behavior of retina and choroid: Secondary | ICD-10-CM | POA: Diagnosis not present

## 2019-04-18 DIAGNOSIS — H04123 Dry eye syndrome of bilateral lacrimal glands: Secondary | ICD-10-CM | POA: Diagnosis not present

## 2019-04-28 DIAGNOSIS — E890 Postprocedural hypothyroidism: Secondary | ICD-10-CM | POA: Diagnosis not present

## 2019-05-28 ENCOUNTER — Other Ambulatory Visit: Payer: Self-pay | Admitting: Hematology & Oncology

## 2019-08-25 ENCOUNTER — Other Ambulatory Visit: Payer: Self-pay | Admitting: Hematology & Oncology

## 2019-08-31 DIAGNOSIS — M8580 Other specified disorders of bone density and structure, unspecified site: Secondary | ICD-10-CM | POA: Diagnosis not present

## 2019-08-31 DIAGNOSIS — E0500 Thyrotoxicosis with diffuse goiter without thyrotoxic crisis or storm: Secondary | ICD-10-CM | POA: Diagnosis not present

## 2019-08-31 DIAGNOSIS — E538 Deficiency of other specified B group vitamins: Secondary | ICD-10-CM | POA: Diagnosis not present

## 2019-08-31 DIAGNOSIS — E890 Postprocedural hypothyroidism: Secondary | ICD-10-CM | POA: Diagnosis not present

## 2019-10-06 DIAGNOSIS — H5210 Myopia, unspecified eye: Secondary | ICD-10-CM | POA: Diagnosis not present

## 2019-11-04 ENCOUNTER — Other Ambulatory Visit: Payer: Self-pay | Admitting: Hematology & Oncology

## 2019-12-04 DIAGNOSIS — M205X1 Other deformities of toe(s) (acquired), right foot: Secondary | ICD-10-CM | POA: Diagnosis not present

## 2019-12-04 DIAGNOSIS — S90212A Contusion of left great toe with damage to nail, initial encounter: Secondary | ICD-10-CM | POA: Diagnosis not present

## 2019-12-04 DIAGNOSIS — B351 Tinea unguium: Secondary | ICD-10-CM | POA: Diagnosis not present

## 2019-12-04 DIAGNOSIS — L602 Onychogryphosis: Secondary | ICD-10-CM | POA: Diagnosis not present

## 2019-12-04 DIAGNOSIS — C441121 Basal cell carcinoma of skin of right upper eyelid, including canthus: Secondary | ICD-10-CM | POA: Diagnosis not present

## 2019-12-04 DIAGNOSIS — M205X2 Other deformities of toe(s) (acquired), left foot: Secondary | ICD-10-CM | POA: Diagnosis not present

## 2019-12-04 DIAGNOSIS — L603 Nail dystrophy: Secondary | ICD-10-CM | POA: Diagnosis not present

## 2019-12-04 DIAGNOSIS — L820 Inflamed seborrheic keratosis: Secondary | ICD-10-CM | POA: Diagnosis not present

## 2019-12-04 DIAGNOSIS — D485 Neoplasm of uncertain behavior of skin: Secondary | ICD-10-CM | POA: Diagnosis not present

## 2019-12-22 ENCOUNTER — Other Ambulatory Visit: Payer: Self-pay | Admitting: Hematology & Oncology

## 2020-02-14 ENCOUNTER — Other Ambulatory Visit: Payer: Self-pay | Admitting: Obstetrics and Gynecology

## 2020-02-14 DIAGNOSIS — Z0000 Encounter for general adult medical examination without abnormal findings: Secondary | ICD-10-CM

## 2020-02-28 ENCOUNTER — Other Ambulatory Visit: Payer: Self-pay | Admitting: Hematology & Oncology

## 2020-03-25 ENCOUNTER — Other Ambulatory Visit: Payer: Self-pay

## 2020-03-25 ENCOUNTER — Ambulatory Visit
Admission: RE | Admit: 2020-03-25 | Discharge: 2020-03-25 | Disposition: A | Discharge status: Home / Self Care | Payer: BC Managed Care – PPO | Source: Ambulatory Visit | Attending: Obstetrics and Gynecology | Admitting: Obstetrics and Gynecology

## 2020-03-25 DIAGNOSIS — Z1231 Encounter for screening mammogram for malignant neoplasm of breast: Secondary | ICD-10-CM | POA: Diagnosis not present

## 2020-03-25 DIAGNOSIS — Z0000 Encounter for general adult medical examination without abnormal findings: Secondary | ICD-10-CM

## 2020-06-23 ENCOUNTER — Other Ambulatory Visit: Payer: Self-pay | Admitting: Hematology & Oncology

## 2020-08-25 ENCOUNTER — Other Ambulatory Visit: Payer: Self-pay | Admitting: Hematology & Oncology

## 2020-11-01 ENCOUNTER — Other Ambulatory Visit: Payer: Self-pay | Admitting: Hematology & Oncology

## 2020-11-18 DIAGNOSIS — Z20822 Contact with and (suspected) exposure to covid-19: Secondary | ICD-10-CM | POA: Diagnosis not present

## 2020-11-29 DIAGNOSIS — E890 Postprocedural hypothyroidism: Secondary | ICD-10-CM | POA: Diagnosis not present

## 2020-11-29 DIAGNOSIS — M8580 Other specified disorders of bone density and structure, unspecified site: Secondary | ICD-10-CM | POA: Diagnosis not present

## 2021-01-04 ENCOUNTER — Other Ambulatory Visit: Payer: Self-pay | Admitting: Hematology & Oncology

## 2021-01-06 ENCOUNTER — Encounter: Payer: Self-pay | Admitting: Hematology & Oncology

## 2021-01-09 DIAGNOSIS — E039 Hypothyroidism, unspecified: Secondary | ICD-10-CM | POA: Insufficient documentation

## 2021-01-09 DIAGNOSIS — Z13820 Encounter for screening for osteoporosis: Secondary | ICD-10-CM | POA: Diagnosis not present

## 2021-01-09 DIAGNOSIS — R8279 Other abnormal findings on microbiological examination of urine: Secondary | ICD-10-CM | POA: Diagnosis not present

## 2021-01-09 DIAGNOSIS — Z1321 Encounter for screening for nutritional disorder: Secondary | ICD-10-CM | POA: Diagnosis not present

## 2021-01-09 DIAGNOSIS — Z13220 Encounter for screening for lipoid disorders: Secondary | ICD-10-CM | POA: Diagnosis not present

## 2021-01-09 DIAGNOSIS — Z6826 Body mass index (BMI) 26.0-26.9, adult: Secondary | ICD-10-CM | POA: Diagnosis not present

## 2021-01-09 DIAGNOSIS — Z13228 Encounter for screening for other metabolic disorders: Secondary | ICD-10-CM | POA: Diagnosis not present

## 2021-01-09 DIAGNOSIS — Z85820 Personal history of malignant melanoma of skin: Secondary | ICD-10-CM | POA: Insufficient documentation

## 2021-01-09 DIAGNOSIS — Z01419 Encounter for gynecological examination (general) (routine) without abnormal findings: Secondary | ICD-10-CM | POA: Diagnosis not present

## 2021-01-20 ENCOUNTER — Other Ambulatory Visit: Payer: Self-pay | Admitting: Obstetrics and Gynecology

## 2021-01-20 DIAGNOSIS — Z1231 Encounter for screening mammogram for malignant neoplasm of breast: Secondary | ICD-10-CM

## 2021-02-19 DIAGNOSIS — R8279 Other abnormal findings on microbiological examination of urine: Secondary | ICD-10-CM | POA: Diagnosis not present

## 2021-02-19 DIAGNOSIS — N390 Urinary tract infection, site not specified: Secondary | ICD-10-CM | POA: Diagnosis not present

## 2021-02-26 ENCOUNTER — Other Ambulatory Visit: Payer: Self-pay | Admitting: Hematology & Oncology

## 2021-03-05 DIAGNOSIS — E890 Postprocedural hypothyroidism: Secondary | ICD-10-CM | POA: Diagnosis not present

## 2021-05-16 ENCOUNTER — Ambulatory Visit
Admission: RE | Admit: 2021-05-16 | Discharge: 2021-05-16 | Disposition: A | Discharge status: Home / Self Care | Payer: BC Managed Care – PPO | Source: Ambulatory Visit | Attending: Obstetrics and Gynecology | Admitting: Obstetrics and Gynecology

## 2021-05-16 ENCOUNTER — Encounter: Payer: Self-pay | Admitting: Hematology & Oncology

## 2021-05-16 DIAGNOSIS — Z1231 Encounter for screening mammogram for malignant neoplasm of breast: Secondary | ICD-10-CM

## 2021-10-09 ENCOUNTER — Other Ambulatory Visit: Payer: Self-pay | Admitting: Hematology & Oncology

## 2021-11-20 DIAGNOSIS — E890 Postprocedural hypothyroidism: Secondary | ICD-10-CM | POA: Diagnosis not present

## 2021-11-20 DIAGNOSIS — M8580 Other specified disorders of bone density and structure, unspecified site: Secondary | ICD-10-CM | POA: Diagnosis not present

## 2021-11-20 DIAGNOSIS — E559 Vitamin D deficiency, unspecified: Secondary | ICD-10-CM | POA: Diagnosis not present

## 2022-04-18 ENCOUNTER — Other Ambulatory Visit: Payer: Self-pay | Admitting: Hematology & Oncology

## 2022-04-29 ENCOUNTER — Other Ambulatory Visit: Payer: Self-pay | Admitting: Obstetrics and Gynecology

## 2022-04-29 DIAGNOSIS — Z1231 Encounter for screening mammogram for malignant neoplasm of breast: Secondary | ICD-10-CM

## 2022-05-14 DIAGNOSIS — Z6827 Body mass index (BMI) 27.0-27.9, adult: Secondary | ICD-10-CM | POA: Diagnosis not present

## 2022-05-14 DIAGNOSIS — Z01419 Encounter for gynecological examination (general) (routine) without abnormal findings: Secondary | ICD-10-CM | POA: Diagnosis not present

## 2022-05-14 DIAGNOSIS — Z124 Encounter for screening for malignant neoplasm of cervix: Secondary | ICD-10-CM | POA: Diagnosis not present

## 2022-05-21 ENCOUNTER — Ambulatory Visit
Admission: RE | Admit: 2022-05-21 | Discharge: 2022-05-21 | Disposition: A | Discharge status: Home / Self Care | Payer: BC Managed Care – PPO | Source: Ambulatory Visit | Attending: Obstetrics and Gynecology | Admitting: Obstetrics and Gynecology

## 2022-05-21 DIAGNOSIS — Z1231 Encounter for screening mammogram for malignant neoplasm of breast: Secondary | ICD-10-CM | POA: Diagnosis not present

## 2022-06-02 DIAGNOSIS — H524 Presbyopia: Secondary | ICD-10-CM | POA: Diagnosis not present

## 2022-06-02 DIAGNOSIS — D3131 Benign neoplasm of right choroid: Secondary | ICD-10-CM | POA: Diagnosis not present

## 2022-06-02 DIAGNOSIS — H5203 Hypermetropia, bilateral: Secondary | ICD-10-CM | POA: Diagnosis not present

## 2022-06-02 DIAGNOSIS — H2513 Age-related nuclear cataract, bilateral: Secondary | ICD-10-CM | POA: Diagnosis not present

## 2022-06-02 DIAGNOSIS — H04123 Dry eye syndrome of bilateral lacrimal glands: Secondary | ICD-10-CM | POA: Diagnosis not present

## 2022-06-02 DIAGNOSIS — H52223 Regular astigmatism, bilateral: Secondary | ICD-10-CM | POA: Diagnosis not present

## 2022-06-05 DIAGNOSIS — R8279 Other abnormal findings on microbiological examination of urine: Secondary | ICD-10-CM | POA: Diagnosis not present

## 2022-08-19 ENCOUNTER — Encounter (HOSPITAL_COMMUNITY): Payer: Self-pay

## 2022-08-19 ENCOUNTER — Emergency Department (HOSPITAL_COMMUNITY)
Admission: EM | Admit: 2022-08-19 | Discharge: 2022-08-19 | Disposition: A | Discharge status: Home / Self Care | Payer: BC Managed Care – PPO | Attending: Emergency Medicine | Admitting: Emergency Medicine

## 2022-08-19 DIAGNOSIS — E039 Hypothyroidism, unspecified: Secondary | ICD-10-CM | POA: Diagnosis not present

## 2022-08-19 DIAGNOSIS — L03116 Cellulitis of left lower limb: Secondary | ICD-10-CM | POA: Diagnosis not present

## 2022-08-19 DIAGNOSIS — Z79890 Hormone replacement therapy (postmenopausal): Secondary | ICD-10-CM | POA: Insufficient documentation

## 2022-08-19 DIAGNOSIS — Z85820 Personal history of malignant melanoma of skin: Secondary | ICD-10-CM | POA: Diagnosis not present

## 2022-08-19 DIAGNOSIS — R2242 Localized swelling, mass and lump, left lower limb: Secondary | ICD-10-CM | POA: Diagnosis not present

## 2022-08-19 LAB — CBC WITH DIFFERENTIAL/PLATELET
Abs Immature Granulocytes: 0.03 10*3/uL (ref 0.00–0.07)
Basophils Absolute: 0 10*3/uL (ref 0.0–0.1)
Basophils Relative: 0 %
Eosinophils Absolute: 0.1 10*3/uL (ref 0.0–0.5)
Eosinophils Relative: 1 %
HCT: 43 % (ref 36.0–46.0)
Hemoglobin: 14.2 g/dL (ref 12.0–15.0)
Immature Granulocytes: 0 %
Lymphocytes Relative: 21 %
Lymphs Abs: 2.1 10*3/uL (ref 0.7–4.0)
MCH: 28.5 pg (ref 26.0–34.0)
MCHC: 33 g/dL (ref 30.0–36.0)
MCV: 86.3 fL (ref 80.0–100.0)
Monocytes Absolute: 0.7 10*3/uL (ref 0.1–1.0)
Monocytes Relative: 8 %
Neutro Abs: 6.7 10*3/uL (ref 1.7–7.7)
Neutrophils Relative %: 70 %
Platelets: 258 10*3/uL (ref 150–400)
RBC: 4.98 MIL/uL (ref 3.87–5.11)
RDW: 13 % (ref 11.5–15.5)
WBC: 9.7 10*3/uL (ref 4.0–10.5)
nRBC: 0 % (ref 0.0–0.2)

## 2022-08-19 LAB — BASIC METABOLIC PANEL
Anion gap: 9 (ref 5–15)
BUN: 13 mg/dL (ref 8–23)
CO2: 25 mmol/L (ref 22–32)
Calcium: 8.7 mg/dL — ABNORMAL LOW (ref 8.9–10.3)
Chloride: 104 mmol/L (ref 98–111)
Creatinine, Ser: 0.84 mg/dL (ref 0.44–1.00)
GFR, Estimated: >60 mL/min (ref >60)
Glucose, Bld: 122 mg/dL — ABNORMAL HIGH (ref 70–99)
Potassium: 3.2 mmol/L — ABNORMAL LOW (ref 3.5–5.1)
Sodium: 138 mmol/L (ref 135–145)

## 2022-08-19 LAB — LACTIC ACID, PLASMA: Lactic Acid, Venous: 0.8 mmol/L (ref 0.5–1.9)

## 2022-08-19 MED ORDER — CLINDAMYCIN HCL 300 MG PO CAPS
450.0000 mg | ORAL_CAPSULE | Freq: Once | ORAL | Status: AC
  Administered 2022-08-19: 450 mg via ORAL
  Filled 2022-08-19: qty 1

## 2022-08-19 MED ORDER — CLINDAMYCIN HCL 150 MG PO CAPS: 450.0000 mg | ORAL_CAPSULE | Freq: Three times a day (TID) | ORAL | 0 refills | Status: AC

## 2022-08-19 MED ORDER — LACTATED RINGERS IV BOLUS
1000.0000 mL | Freq: Once | INTRAVENOUS | Status: AC
  Administered 2022-08-19: 1000 mL via INTRAVENOUS

## 2022-08-19 NOTE — ED Triage Notes (Signed)
Patient reports being bit by something on Friday. Friday she was working out in the yard. Saturday she noticed a bump in the area. Today she says she feels like she has a fever, the area is swollen and hot to touch.

## 2022-08-19 NOTE — ED Provider Notes (Addendum)
[place] EMERGENCY DEPARTMENT AT [facility] Provider Note   CSN: [id number] Arrival date & time: 08/19/22  2007     History  Chief Complaint  Patient presents with   Insect Bite    Katherine Bradford is a 62 y.o. female with HLD, hypothyroidism/graves disease, paresthesias who presents with insect bite.   Patient reports a bump in her L inner thigh that started on Saturday, though she had maybe a boil there or was bit by an insect. She noted an area of erythema that has been growing since then. Monday/Tuesday she felt feverish and had some nausea/vomiting with decreased PO intake but doesn't feel those symptoms now. Now just feels fatigued.  Now the area in her inner L thigh is hot, red, swollen. Reports having cellulitis in her LLE once before, has allergy to Keflex.    HPI     Home Medications Prior to Admission medications   Medication Sig Start Date End Date Taking? Authorizing Provider  CALCIUM PO Take 1 tablet by mouth 3 (three) times a week.   Yes [provider]  cholecalciferol (VITAMIN D3) 25 MCG (1000 UNIT) tablet Take 1,000 Units by mouth daily.   Yes [provider]  clindamycin (CLEOCIN) 150 MG capsule Take 3 capsules (450 mg total) by mouth 3 (three) times daily for 7 days. 08/19/22 08/26/22 Yes Loetta Rough, MD  cyanocobalamin (VITAMIN B12) 1000 MCG/ML injection INJECT 1.5 ML INTRAMUSCULARLY EVERY 2 MONTHS Patient taking differently: Inject 1,000 mcg into the muscle every 30 (thirty) days. 04/20/22  Yes Josph Macho, MD  levothyroxine (SYNTHROID) 88 MCG tablet Take 88 mcg by mouth daily before breakfast.   Yes [provider]  magnesium 30 MG tablet Take 30 mg by mouth daily.   Yes [provider]  vitamin B-12 (CYANOCOBALAMIN) 100 MCG tablet Take 100 mcg by mouth daily.   Yes [provider]      Allergies    Vancomycin, Other, Prednisone, Epinephrine, Keflex [cephalexin], and Levothyroxine    Review of  Systems   Review of Systems Review of systems Positive for subjective fevers.  A 10 point review of systems was performed and is negative unless otherwise reported in HPI.  Physical Exam Updated Vital Signs BP (!) 154/89   Pulse (!) 115   Temp 98.6 F (37 C) (Oral)   Resp 17   Ht 5\' 6"  (1.676 m)   Wt 79.4 kg   LMP 08/26/2012   SpO2 98%   BMI 28.25 kg/m  Physical Exam General: Normal appearing female, lying in bed.  HEENT: Sclera anicteric, MMM, trachea midline.  Cardiology: RRR, no murmurs/rubs/gallops. BL radial and DP pulses equal bilaterally.  Resp: Normal respiratory rate and effort. CTAB, no wheezes, rhonchi, crackles.  Abd: Soft, non-tender, non-distended. No rebound tenderness or guarding.  GU: Deferred. MSK: 4x4 cm area of erythema/induration/TTP surrounded by larger area of erythema raised warm to touch on area of L inner thigh. Does not extend into groin. No palpable fluctuance, crepitus, no bullae/open wounds.  No peripheral edema or signs of trauma. Extremities without deformity or TTP. Soft LE compartments. NVI BL LEs.  Skin: warm, dry. Neuro: A&Ox4, CNs II-XII grossly intact. MAEs. Sensation grossly intact.  Psych: Normal mood and affect.   ED Results / Procedures / Treatments   Labs (all labs ordered are listed, but only abnormal results are displayed) Labs Reviewed  BASIC METABOLIC PANEL - Abnormal; Notable for the following components:      Result  Value   Potassium 3.2 (*)    Glucose, Bld 122 (*)    Calcium 8.7 (*)    All other components within normal limits  CBC WITH DIFFERENTIAL/PLATELET  LACTIC ACID, PLASMA    EKG None  Radiology No results found.  Procedures Procedures    Medications Ordered in ED Medications  lactated ringers bolus 1,000 mL (0 mLs Intravenous Stopped 08/19/22 2227)  clindamycin (CLEOCIN) capsule 450 mg (450 mg Oral Given 08/19/22 2221)    ED Course/ Medical Decision Making/ A&P                          Medical Decision  Making Amount and/or Complexity of Data Reviewed Labs: ordered. Decision-making details documented in ED Course.  Risk Prescription drug management.    This patient presents to the ED for concern of pain/redness/swelling of inner left thigh, this involves an extensive number of treatment options, and is a complaint that carries with it a high risk of complications and morbidity.  Patient is overall well-appearing. HDS.  MDM:    Patient with cellulitis to L inner thigh. Unknown if she actually had an insect bite there or a small nidus of infection such as folliculitis, but she is clinically observed to have cellulitis now. No areas of fluctuance, lower c/f drainable abscess. She is overall very well-appearing, no c/f sepsis or nec fasc. She was mildly tachycardic on arrival she states because she was very nervous, but on evaluation her HR is wnl without any intervention. She is afebrile and without any white count here. She has allergy to keflex, will give one dose of oral clindamycin here and prescribe 7 days. Instructed patient to return if she does not begin to improve within 2-3 days.   Clinical Course as of 08/21/22 1450  Wed Aug 19, 2022  2112 WBC: 9.7 No leukocytosis [HN]  2112 Hemoglobin: 14.2 [HN]    Clinical Course User Index [HN] Loetta Rough, MD    Labs: I Ordered, and personally interpreted labs.  The pertinent results include:  those listed above  Additional history obtained from chart review.   Cardiac Monitoring: The patient was maintained on a cardiac monitor.  I personally viewed and interpreted the cardiac monitored which showed an underlying rhythm of: NSR  Reevaluation: After the interventions noted above, I reevaluated the patient and found that they have :improved  Social Determinants of Health: Patient lives independently   Disposition:  DC w/ discharge instructions/return precautions. All questions answered to patient's satisfaction.    Co  morbidities that complicate the patient evaluation  Past Medical History:  Diagnosis Date   Anemia    B12 deficiency    Graves disease    History of melanoma    Hypothyroid    Acquired   Iron deficiency anemia, unspecified 12/27/2012   Numbness and tingling in hands    Weakness      Medicines Meds ordered this encounter  Medications   lactated ringers bolus 1,000 mL   clindamycin (CLEOCIN) capsule 450 mg   clindamycin (CLEOCIN) 150 MG capsule    Sig: Take 3 capsules (450 mg total) by mouth 3 (three) times daily for 7 days.    Dispense:  63 capsule    Refill:  0    I have reviewed the patients home medicines and have made adjustments as needed  Problem List / ED Course: Problem List Items Addressed This Visit   None Visit Diagnoses  Cellulitis of left lower extremity    -  Primary                   This note was created using dictation software, which may contain spelling or grammatical errors.    Loetta Rough, MD 08/21/22 1451    Loetta Rough, MD 08/21/22 [phone]

## 2022-08-19 NOTE — Discharge Instructions (Addendum)
Thank you for coming to [facility] Emergency Department. You were seen for redness, swelling, pain of your skin of your left thigh. We did an exam, labs, and you have a skin infection called cellulitis. For this we have prescribed clindamycin 450 mg three times per day for 7 days. Please follow up with your primary care provider within 1 week. You can call health connect to establish with a primary care physician. If your symptoms do not improve within the next 2-3 days, please return to the ED.  Do not hesitate to return to the ED or call 911 if you experience: -Worsening symptoms -Lightheadedness, passing out -Fevers/chills -Anything else that concerns you

## 2022-08-20 ENCOUNTER — Emergency Department (HOSPITAL_COMMUNITY)
Admission: EM | Admit: 2022-08-20 | Discharge: 2022-08-20 | Disposition: A | Discharge status: Home / Self Care | Payer: BC Managed Care – PPO | Attending: Emergency Medicine | Admitting: Emergency Medicine

## 2022-08-20 ENCOUNTER — Other Ambulatory Visit: Payer: Self-pay

## 2022-08-20 DIAGNOSIS — L03116 Cellulitis of left lower limb: Secondary | ICD-10-CM | POA: Diagnosis not present

## 2022-08-20 DIAGNOSIS — E039 Hypothyroidism, unspecified: Secondary | ICD-10-CM | POA: Diagnosis not present

## 2022-08-20 DIAGNOSIS — L02416 Cutaneous abscess of left lower limb: Secondary | ICD-10-CM

## 2022-08-20 DIAGNOSIS — Z79899 Other long term (current) drug therapy: Secondary | ICD-10-CM | POA: Insufficient documentation

## 2022-08-20 DIAGNOSIS — M7989 Other specified soft tissue disorders: Secondary | ICD-10-CM | POA: Diagnosis not present

## 2022-08-20 MED ORDER — LIDOCAINE HCL (PF) 1 % IJ SOLN
5.0000 mL | Freq: Once | INTRAMUSCULAR | Status: AC
  Administered 2022-08-20: 5 mL
  Filled 2022-08-20: qty 30

## 2022-08-20 NOTE — ED Provider Notes (Signed)
[place] EMERGENCY DEPARTMENT AT [facility] Provider Note   CSN: [id number] Arrival date & time: 08/20/22  1243     History  Chief Complaint  Patient presents with   Abscess    Katherine Bradford is a 62 y.o. female.  Patient is a 62 year old female with a history of B12 deficiency, iron deficiency, anemia and hypothyroidism presenting today due to persistent pain and swelling in her medial left thigh.  Patient was seen yesterday had normal blood work and was started on clindamycin.  She has taken 3 doses of the clindamycin but noticed she felt like the redness was spreading today.  She has been having some loose stool but otherwise denies any fevers.  Just feeling off.  No history of diabetes or immunocompromising medications.  The history is provided by the patient.       Home Medications Prior to Admission medications   Medication Sig Start Date End Date Taking? Authorizing Provider  CALCIUM PO Take 1 tablet by mouth 3 (three) times a week.    [provider]  cholecalciferol (VITAMIN D3) 25 MCG (1000 UNIT) tablet Take 1,000 Units by mouth daily.    [provider]  clindamycin (CLEOCIN) 150 MG capsule Take 3 capsules (450 mg total) by mouth 3 (three) times daily for 7 days. 08/19/22 08/26/22  Loetta Rough, MD  cyanocobalamin (VITAMIN B12) 1000 MCG/ML injection INJECT 1.5 ML INTRAMUSCULARLY EVERY 2 MONTHS Patient taking differently: Inject 1,000 mcg into the muscle every 30 (thirty) days. 04/20/22   Josph Macho, MD  levothyroxine (SYNTHROID) 88 MCG tablet Take 88 mcg by mouth daily before breakfast.    [provider]  magnesium 30 MG tablet Take 30 mg by mouth daily.    [provider]  vitamin B-12 (CYANOCOBALAMIN) 100 MCG tablet Take 100 mcg by mouth daily.    [provider]      Allergies    Vancomycin, Other, Prednisone, Epinephrine, Keflex [cephalexin], and Levothyroxine    Review of Systems   Review of  Systems  Physical Exam Updated Vital Signs BP 116/74 (BP Location: Left Arm)   Pulse 82   Temp 99.1 F (37.3 C) (Oral)   Resp 16   Ht 5\' 6"  (1.676 m)   Wt 79.3 kg   LMP 08/26/2012   SpO2 95%   BMI 28.22 kg/m  Physical Exam Vitals and nursing note reviewed.  HENT:     Head: Normocephalic.  Cardiovascular:     Rate and Rhythm: Normal rate.  Pulmonary:     Effort: Pulmonary effort is normal.  Musculoskeletal:       Legs:  Neurological:     Mental Status: She is alert. Mental status is at baseline.  Psychiatric:        Mood and Affect: Mood normal.     ED Results / Procedures / Treatments   Labs (all labs ordered are listed, but only abnormal results are displayed) Labs Reviewed - No data to display  EKG None  Radiology No results found.  Procedures Procedures    Medications Ordered in ED Medications  lidocaine (PF) (XYLOCAINE) 1 % injection 5 mL (5 mLs Infiltration Given 08/20/22 1420)    ED Course/ Medical Decision Making/ A&P                             Medical Decision Making Risk Prescription drug management.   Pt with multiple medical problems and  comorbidities and presenting today with a complaint that caries a high risk for morbidity and mortality.  Here today with worsening evidence of abscess and cellulitis.  Patient has only taken 3 doses of antibiotic but abscess was not drained yesterday and feel that she needs drainage.  Discussed this with the patient.  Bedside ultrasound with fluid present.  Will I&D as feel that will significantly improve patient's symptoms and aid in healing.  No findings concerning for Fournier's.  Patient does not appear systemically ill at this time and labs yesterday were reassuring.  2:39 PM I&D with significant amount of purulent drainage.  Small wick placed.  Patient will remove on Sunday if still present, given return precautions, area marked with a skin marker and she will continue to take the  clindamycin.        Final Clinical Impression(s) / ED Diagnoses Final diagnoses:  Abscess of left thigh  Cellulitis of left lower extremity    Rx / DC Orders ED Discharge Orders     None         Gwyneth Sprout, MD 08/20/22 1439

## 2022-08-20 NOTE — Discharge Instructions (Addendum)
It is okay to shower, continue to do warm compresses, if packing is still present on Sunday remove.  By this time tomorrow I expect to will be feeling better and the redness should not have spread beyond the marker.  Continue the clindamycin.  Despite drainage and being on antibiotic and within the next 24 to 48 hours she noticed her symptoms are worsening you should return to the emergency room

## 2022-08-20 NOTE — ED Triage Notes (Signed)
Pt was seen yesterday and dx with cellulitis to left upper thigh states area is larger and and redness is spreading up.

## 2022-08-26 DIAGNOSIS — R87619 Unspecified abnormal cytological findings in specimens from cervix uteri: Secondary | ICD-10-CM | POA: Insufficient documentation

## 2022-08-27 ENCOUNTER — Encounter: Payer: Self-pay | Admitting: Family Medicine

## 2022-08-27 ENCOUNTER — Ambulatory Visit (INDEPENDENT_AMBULATORY_CARE_PROVIDER_SITE_OTHER): Payer: BC Managed Care – PPO | Admitting: Family Medicine

## 2022-08-27 VITALS — BP 125/79 | HR 70 | Ht 66.0 in | Wt 171.0 lb

## 2022-08-27 DIAGNOSIS — E039 Hypothyroidism, unspecified: Secondary | ICD-10-CM | POA: Diagnosis not present

## 2022-08-27 DIAGNOSIS — D509 Iron deficiency anemia, unspecified: Secondary | ICD-10-CM

## 2022-08-27 DIAGNOSIS — E782 Mixed hyperlipidemia: Secondary | ICD-10-CM

## 2022-08-27 DIAGNOSIS — L03116 Cellulitis of left lower limb: Secondary | ICD-10-CM

## 2022-08-27 DIAGNOSIS — Z0000 Encounter for general adult medical examination without abnormal findings: Secondary | ICD-10-CM

## 2022-08-27 DIAGNOSIS — E538 Deficiency of other specified B group vitamins: Secondary | ICD-10-CM | POA: Diagnosis not present

## 2022-08-27 DIAGNOSIS — L02416 Cutaneous abscess of left lower limb: Secondary | ICD-10-CM

## 2022-08-27 LAB — IBC + FERRITIN
Ferritin: 35.9 ng/mL (ref 10.0–291.0)
Iron: 53 ug/dL (ref 42–145)
Saturation Ratios: 15.6 % — ABNORMAL LOW (ref 20.0–50.0)
TIBC: 338.8 ug/dL (ref 250.0–450.0)
Transferrin: 242 mg/dL (ref 212.0–360.0)

## 2022-08-27 LAB — BASIC METABOLIC PANEL
BUN: 10 mg/dL (ref 6–23)
CO2: 30 mEq/L (ref 19–32)
Calcium: 9.3 mg/dL (ref 8.4–10.5)
Chloride: 103 mEq/L (ref 96–112)
Creatinine, Ser: 0.87 mg/dL (ref 0.40–1.20)
GFR: 71.77 mL/min (ref >60.00)
Glucose, Bld: 93 mg/dL (ref 70–99)
Potassium: 4.5 mEq/L (ref 3.5–5.1)
Sodium: 140 mEq/L (ref 135–145)

## 2022-08-27 LAB — CBC WITH DIFFERENTIAL/PLATELET
Basophils Absolute: 0 10*3/uL (ref 0.0–0.1)
Basophils Relative: 1 % (ref 0.0–3.0)
Eosinophils Absolute: 0.2 10*3/uL (ref 0.0–0.7)
Eosinophils Relative: 3.4 % (ref 0.0–5.0)
HCT: 43.1 % (ref 36.0–46.0)
Hemoglobin: 14.6 g/dL (ref 12.0–15.0)
Lymphocytes Relative: 39 % (ref 12.0–46.0)
Lymphs Abs: 1.9 10*3/uL (ref 0.7–4.0)
MCHC: 33.8 g/dL (ref 30.0–36.0)
MCV: 85.8 fl (ref 78.0–100.0)
Monocytes Absolute: 0.3 10*3/uL (ref 0.1–1.0)
Monocytes Relative: 7 % (ref 3.0–12.0)
Neutro Abs: 2.4 10*3/uL (ref 1.4–7.7)
Neutrophils Relative %: 49.6 % (ref 43.0–77.0)
Platelets: 362 10*3/uL (ref 150.0–400.0)
RBC: 5.02 Mil/uL (ref 3.87–5.11)
RDW: 14 % (ref 11.5–15.5)
WBC: 4.8 10*3/uL (ref 4.0–10.5)

## 2022-08-27 LAB — LIPID PANEL
Cholesterol: 210 mg/dL — ABNORMAL HIGH (ref 0–200)
HDL: 46.8 mg/dL (ref >39.00)
NonHDL: 163.13
Total CHOL/HDL Ratio: 4
Triglycerides: 233 mg/dL — ABNORMAL HIGH (ref 0.0–149.0)
VLDL: 46.6 mg/dL — ABNORMAL HIGH (ref 0.0–40.0)

## 2022-08-27 LAB — B12 AND FOLATE PANEL
Folate: 15.6 ng/mL (ref >5.9)
Vitamin B-12: 406 pg/mL (ref 211–911)

## 2022-08-27 LAB — LDL CHOLESTEROL, DIRECT: Direct LDL: 134 mg/dL

## 2022-08-27 NOTE — Progress Notes (Signed)
New Patient Office Visit  Subjective    Patient ID: Katherine Bradford, female    DOB: 03/21/1961  Age: 62 y.o. MRN: [id number]  CC:  Chief Complaint  Patient presents with   Establish Care    HPI Katherine Bradford presents to establish care. She lives with her husband. She works as a Sales executive for [employer].    Anemia, pernicious: - Gives herself B12 injections. She was following with Dr. Katherine Basset, but hasn't seen him in several years.  - Reports she has also had low iron with infusion several years ago.  - She has been feeling a little more fatigued lately.   Hypothyroidism: Graves disease age 33 - radioactive iodine.  Now aquired hypothyroidism, taking Synthroid ([facility]- Dr. Morrison Old in [place])   Hx of Melanoma 2015 (upper right thigh) - sees dermatology annually ([facility] in [place])  Hisotry of HLD - states OBGYN found initial workup and saw mildly elevated cholesterol, han't been checked in at least 5 years. No current medications.   ED follow-up: - She was recently in the hospital for cellulitis that started as a possible insect bite to upper left thigh (noticed a small red mark on a Friday after working in the yard). Nodule and redness quickly progressed. I&D was done and tey placed her on clindamycin - just finished last night.  - Currently she reports area has been improving.  She is not having any pain. Reports she pulled the wick out on Sunday and has not had any more drainage since then. States area of redness and induration is getting smaller. No fevers, chills, body aches, malaise, new rashes/erythema.     Outpatient Encounter Medications as of 08/27/2022  Medication Sig   CALCIUM PO Take 1 tablet by mouth 3 (three) times a week.   cholecalciferol (VITAMIN D3) 25 MCG (1000 UNIT) tablet Take 1,000 Units by mouth daily.   cyanocobalamin (VITAMIN B12) 1000 MCG/ML injection INJECT 1.5 ML INTRAMUSCULARLY EVERY 2 MONTHS (Patient  taking differently: Inject 1,000 mcg into the muscle every 30 (thirty) days.)   levothyroxine (SYNTHROID) 88 MCG tablet Take 88 mcg by mouth daily before breakfast.   magnesium 30 MG tablet Take 30 mg by mouth daily.   vitamin B-12 (CYANOCOBALAMIN) 100 MCG tablet Take 100 mcg by mouth daily.   [EXPIRED] clindamycin (CLEOCIN) 150 MG capsule Take 3 capsules (450 mg total) by mouth 3 (three) times daily for 7 days.   No facility-administered encounter medications on file as of 08/27/2022.    Past Medical History:  Diagnosis Date   Anemia    B12 deficiency    Graves disease    History of melanoma    Hypothyroid    Acquired   Iron deficiency anemia, unspecified 12/27/2012   Numbness and tingling in hands    Weakness     Past Surgical History:  Procedure Laterality Date   CERVICAL FUSION  2015    Family History  Problem Relation Age of Onset   High blood pressure Mother    Osteoarthritis Mother    Thyroid disease Mother    Heart failure Father    Heart disease Father    Diabetes type II Brother    Colon cancer Neg Hx    Breast cancer Neg Hx     Social History   Socioeconomic History   Marital status: Married    Spouse name: Molly Maduro   Number of children: 2   Years of education: 13   Highest education level:  Not on file  Occupational History   Occupation: Sales executive     Comment: Employed at [employer]: [employer]. WILL STANLEY  Tobacco Use   Smoking status: Never   Smokeless tobacco: Never   Tobacco comments:    Never used tobacco  Substance and Sexual Activity   Alcohol use: No    Alcohol/week: 0.0 standard drinks of alcohol   Drug use: No   Sexual activity: Yes    Partners: Male  Other Topics Concern   Not on file  Social History Narrative   Patient lives at home at husband Molly Maduro). Patient works full time. Patient has college education.   Right handed.   Caffeine- one cup daily.   Social Determinants of Health   Financial Resource Strain:  Not on file  Food Insecurity: Not on file  Transportation Needs: Not on file  Physical Activity: Not on file  Stress: Not on file  Social Connections: Not on file  Intimate Partner Violence: Not on file    ROS All review of systems negative except what is listed in the HPI      Objective    BP 125/79   Pulse 70   Ht 5\' 6"  (1.676 m)   Wt 171 lb (77.6 kg)   LMP 08/26/2012   SpO2 96%   BMI 27.60 kg/m   Physical Exam Vitals reviewed.  Constitutional:      Appearance: Normal appearance.  Cardiovascular:     Rate and Rhythm: Normal rate and regular rhythm.     Pulses: Normal pulses.     Heart sounds: Normal heart sounds.  Pulmonary:     Effort: Pulmonary effort is normal.     Breath sounds: Normal breath sounds.  Skin:    General: Skin is warm and dry.     Comments: Left upper leg: recently treated abscess/cellulitis - some resolving induration noted under previous abscess location, but no fluctuance, warmth, drainage, or significant erythema   Neurological:     Mental Status: She is alert and oriented to person, place, and time.  Psychiatric:        Mood and Affect: Mood normal.        Behavior: Behavior normal.        Thought Content: Thought content normal.        Judgment: Judgment normal.         Assessment & Plan:   Problem List Items Addressed This Visit     Vitamin B12 deficiency    Currently taking B12 injections at home Labs today       Relevant Orders   B12 and Folate Panel   Iron deficiency anemia (Chronic)    No supplementation currently  Labs today       Relevant Orders   CBC with Differential/Platelet   IBC + Ferritin   Mixed hyperlipidemia    Medication management: none currently Lifestyle factors for lowering cholesterol include: Diet therapy - heart-healthy diet rich in fruits, veggies, fiber-rich whole grains, lean meats, chicken, fish (at least twice a week), fat-free or 1% dairy products; foods low in saturated/trans fats,  cholesterol, sodium, and sugar. Mediterranean diet has shown to be very heart healthy. Regular exercise - recommend at least 30 minutes a day, 5 times per week Weight management  Repeat CMP and lipid panel today       Relevant Orders   Basic metabolic panel   Lipid panel   Hypothyroidism    Following with endocrinology for thyroid  management      Other Visit Diagnoses     Encounter for medical examination to establish care    -  Primary    Cellulitis and abscess of left leg     Significant improvement No new concerns today Patient aware of signs/symptoms requiring further/urgent evaluation.        Return in about 6 months (around 02/27/2023) for routine follow-up.   Clayborne Dana, NP

## 2022-08-27 NOTE — Assessment & Plan Note (Signed)
Medication management: none currently  Lifestyle factors for lowering cholesterol include: Diet therapy - heart-healthy diet rich in fruits, veggies, fiber-rich whole grains, lean meats, chicken, fish (at least twice a week), fat-free or 1% dairy products; foods low in saturated/trans fats, cholesterol, sodium, and sugar. Mediterranean diet has shown to be very heart healthy. Regular exercise - recommend at least 30 minutes a day, 5 times per week Weight management  Repeat CMP and lipid panel today  

## 2022-08-27 NOTE — Assessment & Plan Note (Signed)
No supplementation currently  Labs today

## 2022-08-27 NOTE — Assessment & Plan Note (Signed)
Following with endocrinology for thyroid management

## 2022-08-27 NOTE — Progress Notes (Signed)
Cellulitis - left thigh Better - still has nodule Clindamycin - just finished yesterday

## 2022-08-27 NOTE — Assessment & Plan Note (Signed)
Currently taking B12 injections at home Labs today

## 2022-08-27 NOTE — Patient Instructions (Signed)
Lesion on your leg where you had cellulitis is looking very good. Continue with warm compresses and monitoring for any signs of infection (redness, warmth, pain, drainage, swelling).   Updating labs today   -------------------------------------   Thank you for choosing [facility] for your Primary Care needs. I am excited for the opportunity to partner with you to meet your health care goals. It was a pleasure meeting you today!  Information on diet, exercise, and health maintenance recommendations are listed below. This is information to help you be sure you are on track for optimal health and monitoring.   Please look over this and let us know if you have any questions or if you have completed any of the health maintenance outside of [facility] so that we can be sure your records are up to date.  ___________________________________________________________  MyChart:  For all urgent or time sensitive needs we ask that you please call the office to avoid delays. Our number is [phone]. MyChart is not constantly monitored and due to the large volume of messages a day, replies may take up to 72 business hours.  MyChart Policy: MyChart allows for you to see your visit notes, after visit summary, provider recommendations, lab and tests results, make an appointment, request refills, and contact your provider or the office for non-urgent questions or concerns. Providers are seeing patients during normal business hours and do not have built in time to review MyChart messages.  We ask that you allow a minimum of 3 business days for responses to [employer]. For this reason, please do not send urgent requests through MyChart. Please call the office at [phone]. New and ongoing conditions may require a visit. We have virtual and in-person visits available for your convenience.  Complex MyChart concerns may require a visit. Your provider may request you  schedule a virtual or in-person visit to ensure we are providing the best care possible. MyChart messages sent after 11:00 AM on Friday will not be received by the provider until Monday morning.    Lab and Test Results: You will receive your lab and test results on MyChart as soon as they are completed and results have been sent by the lab or testing facility. Due to this service, you will receive your results BEFORE your provider.  I review lab and test results each morning prior to seeing patients. Some results require collaboration with other providers to ensure you are receiving the most appropriate care. For this reason, we ask that you please allow a minimum of 3-5 business days from the time that ALL results have been received for your provider to receive and review lab and test results and contact you about these.  Most lab and test result comments from the provider will be sent through MyChart. Your provider may recommend changes to the plan of care, follow-up visits, repeat testing, ask questions, or request an office visit to discuss these results. You may reply directly to this message or call the office to provide information for the provider or set up an appointment. In some instances, you will be called with test results and recommendations. Please let us know if this is preferred and we will make note of this in your chart to provide this for you.    If you have not heard a response to your lab or test results in 5 business days from all results returning to MyChart, please call the office to let us know.  We ask that you please avoid calling prior to this time unless there is an emergent concern. Due to high call volumes, this can delay the resulting process.  After Hours: For all non-emergency after hours needs, please call the office at [phone] and select the option to reach the on-call  service. On-call services are shared between multiple [facility] offices and therefore it will  not be possible to speak directly with your provider. On-call providers may provide medical advice and recommendations, but are unable to provide refills for maintenance medications.  For all emergency or urgent medical needs after normal business hours, we recommend that you seek care at the closest Urgent Care or Emergency Department to ensure appropriate treatment in a timely manner.  [facility] has a 24 hour emergency room located on the ground floor for your convenience.   Urgent Concerns During the Business Day Providers are seeing patients from 8AM to 5PM with a busy schedule and are most often not able to respond to non-urgent calls until the end of the day or the next business day. If you should have URGENT concerns during the day, please call and speak to the nurse or schedule a same day appointment so that we can address your concern without delay.   Thank you, again, for choosing me as your health care partner. I appreciate your trust and look forward to learning more about you!   Lollie Marrow Reola Calkins, DNP, FNP-C  ___________________________________________________________  Health Maintenance Recommendations Screening Testing Mammogram Every 1-2 years based on history and risk factors Starting at age 27 Pap Smear Ages 21-39 every 3 years Ages 74-65 every 5 years with HPV testing More frequent testing may be required based on results and history Colon Cancer Screening Every 1-10 years based on test performed, risk factors, and history Starting at age 52 Bone Density Screening Every 2-10 years based on history Starting at age 80 for women Recommendations for men differ based on medication usage, history, and risk factors AAA Screening One time ultrasound Men 78-28 years old who have ever smoked Lung Cancer Screening Low Dose Lung CT every 12 months Age 21-80 years with a 20 pack-year smoking history who still smoke or who have quit within the last 15  years  Screening Labs Routine  Labs: Complete Blood Count (CBC), Complete Metabolic Panel (CMP), Cholesterol (Lipid Panel) Every 6-12 months based on history and medications May be recommended more frequently based on current conditions or previous results Hemoglobin A1c Lab Every 3-12 months based on history and previous results Starting at age 60 or earlier with diagnosis of diabetes, high cholesterol, BMI >26, and/or risk factors Frequent monitoring for patients with diabetes to ensure blood sugar control Thyroid Panel  Every 6 months based on history, symptoms, and risk factors May be repeated more often if on medication HIV One time testing for all patients 4 and older May be repeated more frequently for patients with increased risk factors or exposure Hepatitis C One time testing for all patients 16 and older May be repeated more frequently for patients with increased risk factors or exposure Gonorrhea, Chlamydia Every 12 months for all sexually active persons 13-24 years Additional monitoring may be recommended for those who are considered high risk or who have symptoms PSA Men 76-74 years old with risk factors Additional screening may be recommended from age 49-69 based on risk factors, symptoms, and history  Vaccine Recommendations Tetanus Booster All adults every 10 years Flu Vaccine All patients 6 months  and older every year COVID Vaccine All patients 12 years and older Initial dosing with booster May recommend additional booster based on age and health history HPV Vaccine 2 doses all patients age 3-26 Dosing may be considered for patients over 26 Shingles Vaccine (Shingrix) 2 doses all adults 50 years and older Pneumonia (Pneumovax 23) All adults 65 years and older May recommend earlier dosing based on health history Pneumonia (Prevnar 55) All adults 65 years and older Dosed 1 year after Pneumovax 23 Pneumonia (Prevnar 20) All adults 65 years and older  (adults 19-64 with certain conditions or risk factors) 1 dose  For those who have not received Prevnar 13 vaccine previously   Additional Screening, Testing, and Vaccinations may be recommended on an individualized basis based on family history, health history, risk factors, and/or exposure.  __________________________________________________________  Diet Recommendations for All Patients  I recommend that all patients maintain a diet low in saturated fats, carbohydrates, and cholesterol. While this can be challenging at first, it is not impossible and small changes can make big differences.  Things to try: Decreasing the amount of soda, sweet tea, and/or juice to one or less per day and replace with water While water is always the first choice, if you do not like water you may consider adding a water additive without sugar to improve the taste other sugar free drinks Replace potatoes with a brightly colored vegetable  Use healthy oils, such as canola oil or olive oil, instead of butter or hard margarine Limit your bread intake to two pieces or less a day Replace regular pasta with low carb pasta options Bake, broil, or grill foods instead of frying Monitor portion sizes  Eat smaller, more frequent meals throughout the day instead of large meals  An important thing to remember is, if you love foods that are not great for your health, you don't have to give them up completely. Instead, allow these foods to be a reward when you have done well. Allowing yourself to still have special treats every once in a while is a nice way to tell yourself thank you for working hard to keep yourself healthy.   Also remember that every day is a new day. If you have a bad day and "fall off the wagon", you can still climb right back up and keep moving along on your journey!  We have resources available to help you!  Some websites that may be helpful  include: www.http://www.wall-moore.info/  Www.VeryWellFit.com _____________________________________________________________  Activity Recommendations for All Patients  I recommend that all adults get at least 20 minutes of moderate physical activity that elevates your heart rate at least 5 days out of the week.  Some examples include: Walking or jogging at a pace that allows you to carry on a conversation Cycling (stationary bike or outdoors) Water aerobics Yoga Weight lifting Dancing If physical limitations prevent you from putting stress on your joints, exercise in a pool or seated in a chair are excellent options.  Do determine your MAXIMUM heart rate for activity: 220 - YOUR AGE = MAX Heart Rate   Remember! Do not push yourself too hard.  Start slowly and build up your pace, speed, weight, time in exercise, etc.  Allow your body to rest between exercise and get good sleep. You will need more water than normal when you are exerting yourself. Do not wait until you are thirsty to drink. Drink with a purpose of getting in at least 8, 8 ounce glasses of water  a day plus more depending on how much you exercise and sweat.    If you begin to develop dizziness, chest pain, abdominal pain, jaw pain, shortness of breath, headache, vision changes, lightheadedness, or other concerning symptoms, stop the activity and allow your body to rest. If your symptoms are severe, seek emergency evaluation immediately. If your symptoms are concerning, but not severe, please let us know so that we can recommend further evaluation.

## 2022-09-02 ENCOUNTER — Encounter: Payer: Self-pay | Admitting: Neurology

## 2022-12-01 ENCOUNTER — Other Ambulatory Visit: Payer: Self-pay | Admitting: Hematology & Oncology

## 2023-01-15 ENCOUNTER — Other Ambulatory Visit: Payer: Self-pay | Admitting: Family Medicine

## 2023-01-15 DIAGNOSIS — Z1211 Encounter for screening for malignant neoplasm of colon: Secondary | ICD-10-CM

## 2023-01-15 DIAGNOSIS — Z1212 Encounter for screening for malignant neoplasm of rectum: Secondary | ICD-10-CM

## 2023-01-20 DIAGNOSIS — E559 Vitamin D deficiency, unspecified: Secondary | ICD-10-CM | POA: Diagnosis not present

## 2023-01-20 DIAGNOSIS — M8580 Other specified disorders of bone density and structure, unspecified site: Secondary | ICD-10-CM | POA: Diagnosis not present

## 2023-01-20 DIAGNOSIS — E890 Postprocedural hypothyroidism: Secondary | ICD-10-CM | POA: Diagnosis not present

## 2023-01-20 DIAGNOSIS — Z1330 Encounter for screening examination for mental health and behavioral disorders, unspecified: Secondary | ICD-10-CM | POA: Diagnosis not present

## 2023-03-03 DIAGNOSIS — B351 Tinea unguium: Secondary | ICD-10-CM | POA: Diagnosis not present

## 2023-04-30 ENCOUNTER — Other Ambulatory Visit: Payer: Self-pay | Admitting: Obstetrics and Gynecology

## 2023-04-30 DIAGNOSIS — Z1231 Encounter for screening mammogram for malignant neoplasm of breast: Secondary | ICD-10-CM

## 2023-05-14 ENCOUNTER — Other Ambulatory Visit: Payer: Self-pay | Admitting: Hematology & Oncology

## 2023-05-17 DIAGNOSIS — Z01419 Encounter for gynecological examination (general) (routine) without abnormal findings: Secondary | ICD-10-CM | POA: Diagnosis not present

## 2023-05-17 DIAGNOSIS — Z6827 Body mass index (BMI) 27.0-27.9, adult: Secondary | ICD-10-CM | POA: Diagnosis not present

## 2023-05-17 DIAGNOSIS — R87610 Atypical squamous cells of undetermined significance on cytologic smear of cervix (ASC-US): Secondary | ICD-10-CM | POA: Diagnosis not present

## 2023-05-28 ENCOUNTER — Ambulatory Visit
Admission: RE | Admit: 2023-05-28 | Discharge: 2023-05-28 | Disposition: A | Discharge status: Home / Self Care | Payer: BC Managed Care – PPO | Source: Ambulatory Visit | Attending: Obstetrics and Gynecology | Admitting: Obstetrics and Gynecology

## 2023-05-28 DIAGNOSIS — Z1231 Encounter for screening mammogram for malignant neoplasm of breast: Secondary | ICD-10-CM

## 2023-06-09 DIAGNOSIS — H04123 Dry eye syndrome of bilateral lacrimal glands: Secondary | ICD-10-CM | POA: Diagnosis not present

## 2023-06-09 DIAGNOSIS — H52223 Regular astigmatism, bilateral: Secondary | ICD-10-CM | POA: Diagnosis not present

## 2023-06-09 DIAGNOSIS — H524 Presbyopia: Secondary | ICD-10-CM | POA: Diagnosis not present

## 2023-06-09 DIAGNOSIS — H5203 Hypermetropia, bilateral: Secondary | ICD-10-CM | POA: Diagnosis not present

## 2023-06-09 DIAGNOSIS — H2513 Age-related nuclear cataract, bilateral: Secondary | ICD-10-CM | POA: Diagnosis not present

## 2023-06-09 DIAGNOSIS — D3131 Benign neoplasm of right choroid: Secondary | ICD-10-CM | POA: Diagnosis not present

## 2023-10-08 DIAGNOSIS — B351 Tinea unguium: Secondary | ICD-10-CM | POA: Diagnosis not present

## 2023-10-23 ENCOUNTER — Other Ambulatory Visit: Payer: Self-pay | Admitting: Hematology & Oncology

## 2023-10-25 ENCOUNTER — Encounter: Payer: Self-pay | Admitting: Hematology & Oncology

## 2023-11-10 ENCOUNTER — Inpatient Hospital Stay

## 2023-11-10 ENCOUNTER — Inpatient Hospital Stay: Admitting: Hematology & Oncology

## 2023-11-17 ENCOUNTER — Ambulatory Visit: Payer: Self-pay | Admitting: Hematology & Oncology

## 2023-11-17 ENCOUNTER — Encounter: Payer: Self-pay | Admitting: Hematology & Oncology

## 2023-11-17 ENCOUNTER — Inpatient Hospital Stay: Attending: Hematology & Oncology

## 2023-11-17 ENCOUNTER — Inpatient Hospital Stay (HOSPITAL_BASED_OUTPATIENT_CLINIC_OR_DEPARTMENT_OTHER): Admitting: Hematology & Oncology

## 2023-11-17 VITALS — BP 139/67 | HR 54 | Temp 97.9°F | Resp 20 | Ht 66.0 in | Wt 168.4 lb

## 2023-11-17 DIAGNOSIS — D510 Vitamin B12 deficiency anemia due to intrinsic factor deficiency: Secondary | ICD-10-CM

## 2023-11-17 DIAGNOSIS — K2940 Chronic atrophic gastritis without bleeding: Secondary | ICD-10-CM

## 2023-11-17 DIAGNOSIS — Z85820 Personal history of malignant melanoma of skin: Secondary | ICD-10-CM | POA: Insufficient documentation

## 2023-11-17 DIAGNOSIS — D509 Iron deficiency anemia, unspecified: Secondary | ICD-10-CM | POA: Diagnosis not present

## 2023-11-17 DIAGNOSIS — K909 Intestinal malabsorption, unspecified: Secondary | ICD-10-CM | POA: Insufficient documentation

## 2023-11-17 DIAGNOSIS — E538 Deficiency of other specified B group vitamins: Secondary | ICD-10-CM

## 2023-11-17 LAB — CBC WITH DIFFERENTIAL (CANCER CENTER ONLY)
Abs Immature Granulocytes: 0.01 K/uL (ref 0.00–0.07)
Basophils Absolute: 0 K/uL (ref 0.0–0.1)
Basophils Relative: 1 %
Eosinophils Absolute: 0.2 K/uL (ref 0.0–0.5)
Eosinophils Relative: 5 %
HCT: 44.1 % (ref 36.0–46.0)
Hemoglobin: 14.6 g/dL (ref 12.0–15.0)
Immature Granulocytes: 0 %
Lymphocytes Relative: 39 %
Lymphs Abs: 1.8 K/uL (ref 0.7–4.0)
MCH: 28.3 pg (ref 26.0–34.0)
MCHC: 33.1 g/dL (ref 30.0–36.0)
MCV: 85.5 fL (ref 80.0–100.0)
Monocytes Absolute: 0.3 K/uL (ref 0.1–1.0)
Monocytes Relative: 6 %
Neutro Abs: 2.2 K/uL (ref 1.7–7.7)
Neutrophils Relative %: 49 %
Platelet Count: 261 K/uL (ref 150–400)
RBC: 5.16 MIL/uL — ABNORMAL HIGH (ref 3.87–5.11)
RDW: 13.3 % (ref 11.5–15.5)
WBC Count: 4.5 K/uL (ref 4.0–10.5)
nRBC: 0 % (ref 0.0–0.2)

## 2023-11-17 LAB — CMP (CANCER CENTER ONLY)
ALT: 21 U/L (ref 0–44)
AST: 19 U/L (ref 15–41)
Albumin: 4.3 g/dL (ref 3.5–5.0)
Alkaline Phosphatase: 104 U/L (ref 38–126)
Anion gap: 10 (ref 5–15)
BUN: 12 mg/dL (ref 8–23)
CO2: 25 mmol/L (ref 22–32)
Calcium: 9.4 mg/dL (ref 8.9–10.3)
Chloride: 106 mmol/L (ref 98–111)
Creatinine: 0.85 mg/dL (ref 0.44–1.00)
GFR, Estimated: >60 mL/min (ref >60)
Glucose, Bld: 112 mg/dL — ABNORMAL HIGH (ref 70–99)
Potassium: 4.4 mmol/L (ref 3.5–5.1)
Sodium: 141 mmol/L (ref 135–145)
Total Bilirubin: 0.4 mg/dL (ref 0.0–1.2)
Total Protein: 7.1 g/dL (ref 6.5–8.1)

## 2023-11-17 LAB — VITAMIN B12: Vitamin B-12: 1551 pg/mL — ABNORMAL HIGH (ref 180–914)

## 2023-11-17 LAB — FERRITIN: Ferritin: 12 ng/mL (ref 11–307)

## 2023-11-17 LAB — IRON AND IRON BINDING CAPACITY (CC-WL,HP ONLY)
Iron: 50 ug/dL (ref 28–170)
Saturation Ratios: 13 % (ref 10.4–31.8)
TIBC: 372 ug/dL (ref 250–450)
UIBC: 322 ug/dL

## 2023-11-17 LAB — SAVE SMEAR(SSMR), FOR PROVIDER SLIDE REVIEW

## 2023-11-17 LAB — RETICULOCYTES
Immature Retic Fract: 5.6 % (ref 2.3–15.9)
RBC.: 5.22 MIL/uL — ABNORMAL HIGH (ref 3.87–5.11)
Retic Count, Absolute: 64.7 K/uL (ref 19.0–186.0)
Retic Ct Pct: 1.2 % (ref 0.4–3.1)

## 2023-11-17 LAB — LACTATE DEHYDROGENASE: LDH: 183 U/L (ref 98–192)

## 2023-11-17 MED ORDER — CYANOCOBALAMIN 1000 MCG/ML IJ SOLN: INTRAMUSCULAR | 12 refills | Status: AC

## 2023-11-17 NOTE — Telephone Encounter (Signed)
 Spoke with pt and advised her of results. Pt agreed to receive IV iron if Dr Timmy suggests. Orders in for Feraheme . Sent scheduling a message to contact pt to schedule infusion appointments.

## 2023-11-17 NOTE — Telephone Encounter (Signed)
-----   Message from Maude JONELLE Crease sent at 11/17/2023  2:41 PM EDT ----- Please call and let her know that the iron level is on the lower side.  She may need some IV iron.  Ask her if she wants to come in for IV iron.  If so we can give it to her.  Thanks.SABRA ----- Message ----- From: Rebecka, Lab In [place] Sent: 11/17/2023  10:56 AM EDT To: Maude JONELLE Crease, MD

## 2023-11-17 NOTE — Progress Notes (Signed)
 Hematology and Oncology Follow Up Visit  Katherine Bradford [id number] 1960/12/26 63 y.o. 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  Current Therapy:   Vitamin B12 1 mg IM q 2 months IV Iron as indicated     Interim History:  Katherine Bradford is back for a long awaited visit.  We last saw her back in 2018.  She has been doing fine.  She really has had no problems.  She is still working as a Armed forces operational officer.  She is quite busy.  She works in a [employer].  She has had no problems with the vitamin B12.  She does this at home.  The last time that we checked her vitamin B12 level was back in May 2024.  It was 406.  She has had no tingling in the hands or feet.  She has had no fatigue or weakness..  She is now a grandmother.  She has 2 grandchildren and 1 on the way.  She is very excited about this.  She has had no change in bowel or bladder habits.  She has had no bleeding.  She gets her mammograms yearly.  I think she is due for a bone density test this year.  Currently, I would say that her performance status is ECOG 0.    Medications:  Current Outpatient Medications:    CALCIUM PO, Take 1 tablet by mouth 3 (three) times a week. (Patient taking differently: Take 1 tablet by mouth 3 (three) times a week. 600 mg three times weekly.), Disp: , Rfl:    cholecalciferol (VITAMIN D3) 25 MCG (1000 UNIT) tablet, Take 1,000 Units by mouth daily., Disp: , Rfl:    cyanocobalamin  (VITAMIN B12) 1000 MCG/ML injection, INJECT 1.5 ML INTRAMUSCULARLY EVERY 2 MONTHS, Disp: 4 mL, Rfl: 1   levothyroxine (SYNTHROID) 88 MCG tablet, Take 88 mcg by mouth daily before breakfast., Disp: , Rfl:    magnesium 30 MG tablet, Take 30 mg by mouth daily., Disp: , Rfl:    vitamin B-12 (CYANOCOBALAMIN ) 100 MCG tablet, Take 100 mcg by mouth daily., Disp:  , Rfl:   Allergies:  Allergies  Allergen Reactions   Vancomycin     Red neck syndrome from IV Vanco.   Other     narcotics   Prednisone Other (See Comments)    Patient prefers not to take due to the edginess it gives her   Epinephrine Palpitations   Keflex [Cephalexin] Rash   Levothyroxine Rash    Past Medical History, Surgical history, Social history, and Family History were reviewed and updated.  Review of Systems: Review of Systems  Constitutional: Negative.   HENT:  Negative.    Eyes: Negative.   Respiratory: Negative.    Cardiovascular: Negative.   Gastrointestinal: Negative.   Endocrine: Negative.   Genitourinary: Negative.    Musculoskeletal: Negative.   Skin: Negative.   Neurological: Negative.   Hematological: Negative.   Psychiatric/Behavioral: Negative.      Physical Exam:  height is 5' 6 (1.676 m) and weight is 168 lb 6.4 oz (76.4 kg). Her oral temperature is 97.9 F (36.6 C). Her blood pressure is 139/67 and her pulse is 54 (abnormal). Her respiration is 20 and oxygen saturation is 99%.   Wt Readings from Last 3 Encounters:  11/17/23 168 lb 6.4 oz (76.4 kg)  08/27/22 171 lb (77.6 kg)  08/20/22 174 lb  13.2 oz (79.3 kg)    Physical Exam Vitals reviewed.  HENT:     Head: Normocephalic and atraumatic.  Eyes:     Pupils: Pupils are equal, round, and reactive to light.  Cardiovascular:     Rate and Rhythm: Normal rate and regular rhythm.     Heart sounds: Normal heart sounds.  Pulmonary:     Effort: Pulmonary effort is normal.     Breath sounds: Normal breath sounds.  Abdominal:     General: Bowel sounds are normal.     Palpations: Abdomen is soft.  Musculoskeletal:        General: No tenderness or deformity. Normal range of motion.     Cervical back: Normal range of motion.  Lymphadenopathy:     Cervical: No cervical adenopathy.  Skin:    General: Skin is warm and dry.     Findings: No erythema or rash.  Neurological:     Mental Status:  She is alert and oriented to person, place, and time.  Psychiatric:        Behavior: Behavior normal.        Thought Content: Thought content normal.        Judgment: Judgment normal.      Lab Results  Component Value Date   WBC 4.5 11/17/2023   HGB 14.6 11/17/2023   HCT 44.1 11/17/2023   MCV 85.5 11/17/2023   PLT 261 11/17/2023     Chemistry      Component Value Date/Time   NA 141 11/17/2023 1046   NA 150 (H) 03/03/2017 0816   NA 141 07/03/2016 0822   K 4.4 11/17/2023 1046   K 3.6 03/03/2017 0816   K 3.9 07/03/2016 0822   CL 106 11/17/2023 1046   CL 104 03/03/2017 0816   CO2 25 11/17/2023 1046   CO2 26 03/03/2017 0816   CO2 26 07/03/2016 0822   BUN 12 11/17/2023 1046   BUN 13 03/03/2017 0816   BUN 17.1 07/03/2016 0822   CREATININE 0.85 11/17/2023 1046   CREATININE 1.0 03/03/2017 0816   CREATININE 0.8 07/03/2016 0822      Component Value Date/Time   CALCIUM 9.4 11/17/2023 1046   CALCIUM 9.9 03/03/2017 0816   CALCIUM 9.4 07/03/2016 0822   ALKPHOS 104 11/17/2023 1046   ALKPHOS 112 (H) 03/03/2017 0816   ALKPHOS 140 07/03/2016 0822   AST 19 11/17/2023 1046   AST 18 07/03/2016 0822   ALT 21 11/17/2023 1046   ALT 24 03/03/2017 0816   ALT 21 07/03/2016 0822   BILITOT 0.4 11/17/2023 1046   BILITOT 0.38 07/03/2016 0822       Impression and Plan: Katherine Bradford is a very charming 63 year old white female.  Again I have known her for many years.  She has done very nicely.  I forgot to mention that her last iron studies that were done back in May 2024 showed a ferritin of 36 and iron saturation of 16%.  We will see what her B12 level is.  We will see what her iron levels are.  At this point, I really do not see the need to have her come back to see us .  She is considering come back if there are problems.  However, she is so well versed with her health that there is a problem and she knows to give us  a call.   Katherine JONELLE Crease, MD 8/6/20251:34 PM

## 2023-11-23 ENCOUNTER — Telehealth: Payer: Self-pay | Admitting: Hematology & Oncology

## 2023-11-23 NOTE — Telephone Encounter (Signed)
 Left vm to return call for scheduling 2 doses of Feraheme  per inbasket.

## 2023-12-06 ENCOUNTER — Telehealth: Payer: Self-pay | Admitting: Hematology & Oncology

## 2023-12-06 NOTE — Telephone Encounter (Signed)
 Called and lvm on 11/23/23 and 12/06/23 for pt to return call for scheduling 2 doses of IV iron.

## 2023-12-15 ENCOUNTER — Telehealth: Payer: Self-pay | Admitting: Family

## 2023-12-15 NOTE — Telephone Encounter (Signed)
 08/12, 08/25 and 09/03 called and lvm for patient to return call for scheduling IV iron per inbasket.

## 2024-01-07 ENCOUNTER — Inpatient Hospital Stay: Attending: Hematology & Oncology

## 2024-01-07 VITALS — BP 120/68 | HR 54 | Temp 97.6°F

## 2024-01-07 DIAGNOSIS — D510 Vitamin B12 deficiency anemia due to intrinsic factor deficiency: Secondary | ICD-10-CM | POA: Insufficient documentation

## 2024-01-07 DIAGNOSIS — D509 Iron deficiency anemia, unspecified: Secondary | ICD-10-CM | POA: Insufficient documentation

## 2024-01-07 MED ORDER — SODIUM CHLORIDE 0.9 % IV SOLN: INTRAVENOUS | Status: DC

## 2024-01-07 MED ORDER — SODIUM CHLORIDE 0.9 % IV SOLN
510.0000 mg | Freq: Once | INTRAVENOUS | Status: AC
  Administered 2024-01-07: 510 mg via INTRAVENOUS
  Filled 2024-01-07: qty 17

## 2024-01-07 NOTE — Progress Notes (Signed)
 Pt. in for IV iron. Tolerated well. Denied any issue. Monitored post without reaction.

## 2024-01-07 NOTE — Patient Instructions (Signed)
 [facility] - A DEPT OF [facility]  Discharge Instructions: Thank you for choosing [facility] to provide your oncology and hematology care.   If you have a lab appointment with the Cancer Center, please go directly to the Cancer Center and check in at the registration area.  Wear comfortable clothing and clothing appropriate for easy access to any Portacath or PICC line.   We strive to give you quality time with your provider. You may need to reschedule your appointment if you arrive late (15 or more minutes).  Arriving late affects you and other patients whose appointments are after yours.  Also, if you miss three or more appointments without notifying the office, you may be dismissed from the clinic at the provider's discretion.      For prescription refill requests, have your pharmacy contact our office and allow 72 hours for refills to be completed.    Today you received the following chemotherapy and/or immunotherapy agents Feraheme      To help prevent nausea and vomiting after your treatment, we encourage you to take your nausea medication as directed.  BELOW ARE SYMPTOMS THAT SHOULD BE REPORTED IMMEDIATELY: *FEVER GREATER THAN 100.4 F (38 C) OR HIGHER *CHILLS OR SWEATING *NAUSEA AND VOMITING THAT IS NOT CONTROLLED WITH YOUR NAUSEA MEDICATION *UNUSUAL SHORTNESS OF BREATH *UNUSUAL BRUISING OR BLEEDING *URINARY PROBLEMS (pain or burning when urinating, or frequent urination) *BOWEL PROBLEMS (unusual diarrhea, constipation, pain near the anus) TENDERNESS IN MOUTH AND THROAT WITH OR WITHOUT PRESENCE OF ULCERS (sore throat, sores in mouth, or a toothache) UNUSUAL RASH, SWELLING OR PAIN  UNUSUAL VAGINAL DISCHARGE OR ITCHING   Items with * indicate a potential emergency and should be followed up as soon as possible or go to the Emergency Department if any problems should occur.  Please show the CHEMOTHERAPY ALERT CARD or IMMUNOTHERAPY  ALERT CARD at check-in to the Emergency Department and triage nurse. Should you have questions after your visit or need to cancel or reschedule your appointment, please contact [facility] - A DEPT OF [facility]  [phone] and follow the prompts.  Office hours are 8:00 a.m. to 4:30 p.m. Monday - Friday. Please note that voicemails left after 4:00 p.m. may not be returned until the following business day.  We are closed weekends and major holidays. You have access to a nurse at all times for urgent questions. Please call the main number to the clinic [phone] and follow the prompts.  For any non-urgent questions, you may also contact your provider using MyChart. We now offer e-Visits for anyone 47 and older to request care online for non-urgent symptoms. For details visit [email or web address].   Also download the MyChart app! Go to the app store, search MyChart, open the app, select [facility], and log in with your MyChart username and password.

## 2024-01-14 ENCOUNTER — Inpatient Hospital Stay: Attending: Hematology & Oncology

## 2024-01-14 VITALS — BP 115/70 | HR 66 | Temp 97.9°F | Resp 18

## 2024-01-14 DIAGNOSIS — D509 Iron deficiency anemia, unspecified: Secondary | ICD-10-CM

## 2024-01-14 DIAGNOSIS — K2940 Chronic atrophic gastritis without bleeding: Secondary | ICD-10-CM | POA: Insufficient documentation

## 2024-01-14 DIAGNOSIS — K909 Intestinal malabsorption, unspecified: Secondary | ICD-10-CM | POA: Insufficient documentation

## 2024-01-14 DIAGNOSIS — D508 Other iron deficiency anemias: Secondary | ICD-10-CM | POA: Insufficient documentation

## 2024-01-14 MED ORDER — SODIUM CHLORIDE 0.9 % IV SOLN
510.0000 mg | Freq: Once | INTRAVENOUS | Status: AC
  Administered 2024-01-14: 510 mg via INTRAVENOUS
  Filled 2024-01-14: qty 17

## 2024-01-14 MED ORDER — SODIUM CHLORIDE 0.9 % IV SOLN: INTRAVENOUS | Status: DC

## 2024-01-14 NOTE — Patient Instructions (Signed)

## 2024-03-07 DIAGNOSIS — Z1330 Encounter for screening examination for mental health and behavioral disorders, unspecified: Secondary | ICD-10-CM | POA: Diagnosis not present

## 2024-03-07 DIAGNOSIS — M8580 Other specified disorders of bone density and structure, unspecified site: Secondary | ICD-10-CM | POA: Diagnosis not present

## 2024-03-07 DIAGNOSIS — E890 Postprocedural hypothyroidism: Secondary | ICD-10-CM | POA: Diagnosis not present

## 2024-03-07 DIAGNOSIS — E559 Vitamin D deficiency, unspecified: Secondary | ICD-10-CM | POA: Diagnosis not present

## 2024-05-05 ENCOUNTER — Other Ambulatory Visit: Payer: Self-pay | Admitting: Obstetrics and Gynecology

## 2024-05-05 DIAGNOSIS — Z1231 Encounter for screening mammogram for malignant neoplasm of breast: Secondary | ICD-10-CM

## 2024-06-02 ENCOUNTER — Ambulatory Visit

## 2024-11-17 ENCOUNTER — Ambulatory Visit: Admitting: Family

## 2024-11-17 ENCOUNTER — Inpatient Hospital Stay
